# Patient Record
Sex: Female | Born: 1975 | Race: White | Hispanic: No | Marital: Married | Smoking: Former smoker
Health system: Southern US, Community
[De-identification: ages and names within clinical notes are randomized; demographics above are authoritative.]

## PROBLEM LIST (undated history)

## (undated) DIAGNOSIS — D860 Sarcoidosis of lung: Secondary | ICD-10-CM

## (undated) DIAGNOSIS — C55 Malignant neoplasm of uterus, part unspecified: Secondary | ICD-10-CM

## (undated) HISTORY — DX: Malignant neoplasm of uterus, part unspecified: C55

## (undated) HISTORY — DX: Sarcoidosis of lung: D86.0

---

## 2021-03-11 ENCOUNTER — Emergency Department (HOSPITAL_COMMUNITY)
Admission: EM | Admit: 2021-03-11 | Discharge: 2021-03-11 | Disposition: A | Discharge status: Home / Self Care | Payer: 59 | Attending: Student | Admitting: Student

## 2021-03-11 ENCOUNTER — Encounter (HOSPITAL_COMMUNITY): Payer: Self-pay

## 2021-03-11 ENCOUNTER — Emergency Department (HOSPITAL_COMMUNITY): Payer: 59

## 2021-03-11 DIAGNOSIS — R079 Chest pain, unspecified: Secondary | ICD-10-CM | POA: Diagnosis not present

## 2021-03-11 DIAGNOSIS — R519 Headache, unspecified: Secondary | ICD-10-CM | POA: Diagnosis not present

## 2021-03-11 DIAGNOSIS — R42 Dizziness and giddiness: Secondary | ICD-10-CM | POA: Diagnosis not present

## 2021-03-11 DIAGNOSIS — R110 Nausea: Secondary | ICD-10-CM | POA: Insufficient documentation

## 2021-03-11 DIAGNOSIS — R0789 Other chest pain: Secondary | ICD-10-CM | POA: Diagnosis not present

## 2021-03-11 DIAGNOSIS — Z20822 Contact with and (suspected) exposure to covid-19: Secondary | ICD-10-CM | POA: Insufficient documentation

## 2021-03-11 DIAGNOSIS — R9431 Abnormal electrocardiogram [ECG] [EKG]: Secondary | ICD-10-CM | POA: Diagnosis not present

## 2021-03-11 DIAGNOSIS — R030 Elevated blood-pressure reading, without diagnosis of hypertension: Secondary | ICD-10-CM | POA: Insufficient documentation

## 2021-03-11 DIAGNOSIS — I10 Essential (primary) hypertension: Secondary | ICD-10-CM | POA: Diagnosis not present

## 2021-03-11 LAB — CBC WITH DIFFERENTIAL/PLATELET
Abs Immature Granulocytes: 0.01 10*3/uL (ref 0.00–0.07)
Basophils Absolute: 0 10*3/uL (ref 0.0–0.1)
Basophils Relative: 1 %
Eosinophils Absolute: 0.2 10*3/uL (ref 0.0–0.5)
Eosinophils Relative: 3 %
HCT: 40.4 % (ref 36.0–46.0)
Hemoglobin: 12.9 g/dL (ref 12.0–15.0)
Immature Granulocytes: 0 %
Lymphocytes Relative: 19 %
Lymphs Abs: 1.3 10*3/uL (ref 0.7–4.0)
MCH: 27.9 pg (ref 26.0–34.0)
MCHC: 31.9 g/dL (ref 30.0–36.0)
MCV: 87.3 fL (ref 80.0–100.0)
Monocytes Absolute: 0.4 10*3/uL (ref 0.1–1.0)
Monocytes Relative: 6 %
Neutro Abs: 4.7 10*3/uL (ref 1.7–7.7)
Neutrophils Relative %: 71 %
Platelets: 263 10*3/uL (ref 150–400)
RBC: 4.63 MIL/uL (ref 3.87–5.11)
RDW: 13 % (ref 11.5–15.5)
WBC: 6.6 10*3/uL (ref 4.0–10.5)
nRBC: 0 % (ref 0.0–0.2)

## 2021-03-11 LAB — COMPREHENSIVE METABOLIC PANEL
ALT: 6 U/L (ref 0–44)
AST: 13 U/L — ABNORMAL LOW (ref 15–41)
Albumin: 4.4 g/dL (ref 3.5–5.0)
Alkaline Phosphatase: 68 U/L (ref 38–126)
Anion gap: 8 (ref 5–15)
BUN: 12 mg/dL (ref 6–20)
CO2: 26 mmol/L (ref 22–32)
Calcium: 9.3 mg/dL (ref 8.9–10.3)
Chloride: 104 mmol/L (ref 98–111)
Creatinine, Ser: 0.88 mg/dL (ref 0.44–1.00)
GFR, Estimated: >60 mL/min (ref >60)
Glucose, Bld: 96 mg/dL (ref 70–99)
Potassium: 3.8 mmol/L (ref 3.5–5.1)
Sodium: 138 mmol/L (ref 135–145)
Total Bilirubin: 0.6 mg/dL (ref 0.3–1.2)
Total Protein: 7.6 g/dL (ref 6.5–8.1)

## 2021-03-11 LAB — TROPONIN I (HIGH SENSITIVITY)
Troponin I (High Sensitivity): <2 ng/L (ref <18)
Troponin I (High Sensitivity): <2 ng/L (ref <18)

## 2021-03-11 LAB — RESP PANEL BY RT-PCR (FLU A&B, COVID) ARPGX2
Influenza A by PCR: NEGATIVE
Influenza B by PCR: NEGATIVE
SARS Coronavirus 2 by RT PCR: NEGATIVE

## 2021-03-11 MED ORDER — ACETAMINOPHEN 325 MG PO TABS
650.0000 mg | ORAL_TABLET | Freq: Once | ORAL | Status: AC
  Administered 2021-03-11: 650 mg via ORAL
  Filled 2021-03-11: qty 2

## 2021-03-11 MED ORDER — ASPIRIN 81 MG PO CHEW
324.0000 mg | CHEWABLE_TABLET | Freq: Once | ORAL | Status: AC
  Administered 2021-03-11: 324 mg via ORAL
  Filled 2021-03-11: qty 4

## 2021-03-11 NOTE — ED Triage Notes (Signed)
Pt arrived from work, states sudden onset left sided chest pain x1 hr ago, radiating to left arm. States earlier today she had headache and numbness in left hand/fingers. 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

## 2021-03-11 NOTE — ED Provider Notes (Signed)
Emergency Medicine Provider Triage Evaluation Note  Natasha Horton , a 45 y.o. female  was evaluated in triage.  Pt complains of chest pain and pressure, dizziness, lightheadedness, nausea.  Symptoms began around 9:00 this morning.  Initially were intermittent, but now is more severe.  History of inverted T waves, no other cardiac history.  Review of Systems  Positive: cp Negative: sob  Physical Exam  BP (!) 148/105 (BP Location: Left Arm)   Pulse 87   Temp 98.5 F (36.9 C) (Oral)   Resp 18   SpO2 100%  Gen:   Awake, no distress   Resp:  Normal effort  MSK:   Moves extremities without difficulty    Medical Decision Making  Medically screening exam initiated at 12:20 PM.  Appropriate orders placed.  Natasha Horton was informed that the remainder of the evaluation will be completed by another provider, this initial triage assessment does not replace that evaluation, and the importance of remaining in the ED until their evaluation is complete.  Cxr, ekg, labs   Franchot Heidelberg, PA-C 03/11/21 [place], [place], MD 03/11/21 1721

## 2021-03-11 NOTE — ED Provider Notes (Signed)
[facility] DEPT Provider Note   CSN: [id number] Arrival date & time: 03/11/21  1147     History Chief Complaint  Patient presents with   Chest Pain    Natasha Horton is a 45 y.o. female presenting for evaluation of chest pain.  Patient states around 9:00 today she developed chest pain.  She had associated nausea and lightheadedness.  She also has a headache.  She has not taken anything for her symptoms.  Has not had any aspirin.  She has a history of inverted T waves, but no other cardiac history.  She has no other medical problems.  She will also very concerned about her blood pressure, which is been in the 150s over 100 since this began.  No history of high blood pressure.  She denies shortness of breath, diaphoresis, vomiting, abdominal pain, urinary symptoms, abnormal bowel movements.  HPI     History reviewed. No pertinent past medical history.  There are no problems to display for this patient.    OB History   No obstetric history on file.     History reviewed. No pertinent family history.     Home Medications Prior to Admission medications   Not on File    Allergies    Patient has no known allergies.  Review of Systems   Review of Systems  Cardiovascular:  Positive for chest pain.  Gastrointestinal:  Positive for nausea.  Neurological:  Positive for light-headedness.  All other systems reviewed and are negative.  Physical Exam Updated Vital Signs BP (!) 146/93   Pulse 82   Temp 98 F (36.7 C) (Oral)   Resp 18   SpO2 100%   Physical Exam Vitals and nursing note reviewed.  Constitutional:      General: She is not in acute distress.    Appearance: Normal appearance.     Comments: nontoxic  HENT:     Head: Normocephalic and atraumatic.  Eyes:     Conjunctiva/sclera: Conjunctivae normal.     Pupils: Pupils are equal, round, and reactive to light.  Cardiovascular:     Rate and Rhythm: Normal rate and regular  rhythm.     Pulses: Normal pulses.  Pulmonary:     Effort: Pulmonary effort is normal. No respiratory distress.     Breath sounds: Normal breath sounds. No wheezing.     Comments: Speaking in full sentences.  Clear lung sounds in all fields. Abdominal:     General: There is no distension.     Palpations: Abdomen is soft. There is no mass.     Tenderness: There is no abdominal tenderness. There is no guarding or rebound.  Musculoskeletal:        General: Normal range of motion.     Cervical back: Normal range of motion and neck supple.  Skin:    General: Skin is warm and dry.     Capillary Refill: Capillary refill takes less than 2 seconds.  Neurological:     Mental Status: She is alert and oriented to person, place, and time.  Psychiatric:        Mood and Affect: Mood and affect normal.        Speech: Speech normal.        Behavior: Behavior normal.    ED Results / Procedures / Treatments   Labs (all labs ordered are listed, but only abnormal results are displayed) Labs Reviewed  COMPREHENSIVE METABOLIC PANEL - Abnormal; Notable for the following components:  Result Value   AST 13 (*)    All other components within normal limits  RESP PANEL BY RT-PCR (FLU A&B, COVID) ARPGX2  CBC WITH DIFFERENTIAL/PLATELET  I-STAT BETA HCG BLOOD, ED ([facility], [facility], [facility] ONLY)  TROPONIN I (HIGH SENSITIVITY)  TROPONIN I (HIGH SENSITIVITY)    EKG EKG Interpretation  Date/Time:  Wednesday March 11 2021 12:03:24 EDT Ventricular Rate:  78 PR Interval:  156 QRS Duration: 138 QT Interval:  413 QTC Calculation: 471 R Axis:   89 Text Interpretation: Sinus rhythm Right bundle branch block NO prior ECG for comparison. No STEMI Confirmed by Antony Blackbird [id number]) on 03/11/2021 6:01:39 PM  Radiology DG Chest 2 View  Result Date: 03/11/2021 CLINICAL DATA:  Chest pain EXAM: CHEST - 2 VIEW COMPARISON:  None. FINDINGS: Heart size and mediastinal contours are within normal limits. No suspicious  opacities identified. No pleural effusion or pneumothorax visualized. No acute osseous abnormality appreciated. IMPRESSION: No acute intrathoracic process identified. Electronically Signed   By: Ofilia Neas   On: 03/11/2021 13:10    Procedures Procedures   Medications Ordered in ED Medications  aspirin chewable tablet 324 mg (324 mg Oral Given 03/11/21 1227)  acetaminophen (TYLENOL) tablet 650 mg (650 mg Oral Given 03/11/21 1227)    ED Course  I have reviewed the triage vital signs and the nursing notes.  Pertinent labs & imaging results that were available during my care of the patient were reviewed by me and considered in my medical decision making (see chart for details).   MDM Rules/Calculators/A&P                           Patient presenting for evaluation of chest pain.  On exam, patient is nontoxic.  She is concerned about slightly elevated blood pressure, however I have low suspicion that these numbers are causing any signs of endorgan damage.  However in the setting of acute onset chest pain, will obtain troponins x2, labs, EKG, chest x-ray.  Aspirin given while work-up is pending.  Labs interpreted by me, overall reassuring.  Troponin negative x2.  Chest x-ray viewed and independently interpreted by me, no pneumonia pneumothorax, effusion.  EKG is nonischemic, shows a right bundle branch block.  Patient instructed to follow-up with cardiology for abnormal EKG.  Discussed follow-up with PCP as needed for blood pressure recheck, however elevated blood pressure today could be due to the pain.  On reevaluation, patient has no chest pain anymore.  She does continue to have a mild headache and feel slightly fatigued.  Will obtain respiratory panel.  Patient is aware results are pending.  At this time, patient appears safe for discharge.  Return precautions given.  Patient states she understands and agrees to plan  Final Clinical Impression(s) / ED Diagnoses Final diagnoses:   Atypical chest pain  Abnormal EKG  Elevated blood pressure reading in office without diagnosis of hypertension    Rx / DC Orders ED Discharge Orders     None        Franchot Heidelberg, PA-C 03/11/21 1843    [place], Gwenyth Allegra, MD 03/11/21 [phone]

## 2021-03-11 NOTE — Discharge Instructions (Addendum)
Your work up today was overall reassuring. Your troponins are negative x2.  Follow up with the cardiologist for further evaluation of your chest pain.  Follow up with your primary care doctor for further evaluation of your blood pressure. Return to the ER with any new, worsening, or concerning symptoms.

## 2021-03-11 NOTE — ED Notes (Signed)
An After Visit Summary was printed and given to the patient. Discharge instructions given and no further questions at this time.  

## 2021-03-12 DIAGNOSIS — R4586 Emotional lability: Secondary | ICD-10-CM | POA: Diagnosis not present

## 2021-03-12 DIAGNOSIS — R9431 Abnormal electrocardiogram [ECG] [EKG]: Secondary | ICD-10-CM | POA: Diagnosis not present

## 2021-03-12 DIAGNOSIS — F419 Anxiety disorder, unspecified: Secondary | ICD-10-CM | POA: Diagnosis not present

## 2021-03-12 DIAGNOSIS — R079 Chest pain, unspecified: Secondary | ICD-10-CM | POA: Diagnosis not present

## 2021-03-19 DIAGNOSIS — R9431 Abnormal electrocardiogram [ECG] [EKG]: Secondary | ICD-10-CM | POA: Diagnosis not present

## 2021-03-19 DIAGNOSIS — E669 Obesity, unspecified: Secondary | ICD-10-CM | POA: Diagnosis not present

## 2021-03-19 DIAGNOSIS — R030 Elevated blood-pressure reading, without diagnosis of hypertension: Secondary | ICD-10-CM | POA: Diagnosis not present

## 2021-03-19 DIAGNOSIS — R002 Palpitations: Secondary | ICD-10-CM | POA: Diagnosis not present

## 2021-03-19 DIAGNOSIS — Z87891 Personal history of nicotine dependence: Secondary | ICD-10-CM | POA: Diagnosis not present

## 2021-03-19 DIAGNOSIS — R079 Chest pain, unspecified: Secondary | ICD-10-CM | POA: Diagnosis not present

## 2021-04-01 DIAGNOSIS — R002 Palpitations: Secondary | ICD-10-CM | POA: Diagnosis not present

## 2021-04-01 DIAGNOSIS — R079 Chest pain, unspecified: Secondary | ICD-10-CM | POA: Diagnosis not present

## 2021-04-01 DIAGNOSIS — R9431 Abnormal electrocardiogram [ECG] [EKG]: Secondary | ICD-10-CM | POA: Diagnosis not present

## 2021-04-07 DIAGNOSIS — R9431 Abnormal electrocardiogram [ECG] [EKG]: Secondary | ICD-10-CM | POA: Diagnosis not present

## 2021-04-07 DIAGNOSIS — R079 Chest pain, unspecified: Secondary | ICD-10-CM | POA: Diagnosis not present

## 2021-04-07 DIAGNOSIS — R002 Palpitations: Secondary | ICD-10-CM | POA: Diagnosis not present

## 2021-04-15 DIAGNOSIS — R079 Chest pain, unspecified: Secondary | ICD-10-CM | POA: Diagnosis not present

## 2021-04-27 DIAGNOSIS — J4530 Mild persistent asthma, uncomplicated: Secondary | ICD-10-CM | POA: Diagnosis not present

## 2021-04-27 DIAGNOSIS — F419 Anxiety disorder, unspecified: Secondary | ICD-10-CM | POA: Diagnosis not present

## 2021-04-27 DIAGNOSIS — R590 Localized enlarged lymph nodes: Secondary | ICD-10-CM | POA: Diagnosis not present

## 2021-04-27 DIAGNOSIS — R002 Palpitations: Secondary | ICD-10-CM | POA: Diagnosis not present

## 2021-04-27 DIAGNOSIS — D860 Sarcoidosis of lung: Secondary | ICD-10-CM | POA: Diagnosis not present

## 2021-04-27 DIAGNOSIS — R9431 Abnormal electrocardiogram [ECG] [EKG]: Secondary | ICD-10-CM | POA: Diagnosis not present

## 2021-04-27 DIAGNOSIS — Z87891 Personal history of nicotine dependence: Secondary | ICD-10-CM | POA: Diagnosis not present

## 2021-05-12 DIAGNOSIS — B349 Viral infection, unspecified: Secondary | ICD-10-CM | POA: Diagnosis not present

## 2021-05-12 DIAGNOSIS — D860 Sarcoidosis of lung: Secondary | ICD-10-CM | POA: Diagnosis not present

## 2021-05-12 DIAGNOSIS — J020 Streptococcal pharyngitis: Secondary | ICD-10-CM | POA: Diagnosis not present

## 2021-05-14 DIAGNOSIS — R590 Localized enlarged lymph nodes: Secondary | ICD-10-CM | POA: Diagnosis not present

## 2021-05-20 DIAGNOSIS — Z8659 Personal history of other mental and behavioral disorders: Secondary | ICD-10-CM | POA: Diagnosis not present

## 2021-05-20 DIAGNOSIS — G4700 Insomnia, unspecified: Secondary | ICD-10-CM | POA: Diagnosis not present

## 2021-05-20 DIAGNOSIS — F411 Generalized anxiety disorder: Secondary | ICD-10-CM | POA: Diagnosis not present

## 2021-05-20 DIAGNOSIS — F419 Anxiety disorder, unspecified: Secondary | ICD-10-CM | POA: Diagnosis not present

## 2021-05-25 DIAGNOSIS — R002 Palpitations: Secondary | ICD-10-CM | POA: Diagnosis not present

## 2021-05-25 DIAGNOSIS — Z87891 Personal history of nicotine dependence: Secondary | ICD-10-CM | POA: Diagnosis not present

## 2021-05-25 DIAGNOSIS — I4729 Other ventricular tachycardia: Secondary | ICD-10-CM | POA: Diagnosis not present

## 2021-05-25 DIAGNOSIS — E669 Obesity, unspecified: Secondary | ICD-10-CM | POA: Diagnosis not present

## 2021-05-25 DIAGNOSIS — R9431 Abnormal electrocardiogram [ECG] [EKG]: Secondary | ICD-10-CM | POA: Diagnosis not present

## 2021-05-25 DIAGNOSIS — R079 Chest pain, unspecified: Secondary | ICD-10-CM | POA: Diagnosis not present

## 2021-05-29 DIAGNOSIS — Z87891 Personal history of nicotine dependence: Secondary | ICD-10-CM | POA: Diagnosis not present

## 2021-05-29 DIAGNOSIS — J4530 Mild persistent asthma, uncomplicated: Secondary | ICD-10-CM | POA: Diagnosis not present

## 2021-05-29 DIAGNOSIS — D860 Sarcoidosis of lung: Secondary | ICD-10-CM | POA: Diagnosis not present

## 2021-06-11 DIAGNOSIS — F419 Anxiety disorder, unspecified: Secondary | ICD-10-CM | POA: Diagnosis not present

## 2021-06-11 DIAGNOSIS — G4700 Insomnia, unspecified: Secondary | ICD-10-CM | POA: Diagnosis not present

## 2021-06-11 DIAGNOSIS — F411 Generalized anxiety disorder: Secondary | ICD-10-CM | POA: Diagnosis not present

## 2021-06-11 DIAGNOSIS — Z8659 Personal history of other mental and behavioral disorders: Secondary | ICD-10-CM | POA: Diagnosis not present

## 2021-07-01 ENCOUNTER — Other Ambulatory Visit (HOSPITAL_COMMUNITY): Payer: Self-pay

## 2021-07-02 ENCOUNTER — Other Ambulatory Visit (HOSPITAL_COMMUNITY): Payer: Self-pay

## 2021-07-02 MED ORDER — MONTELUKAST SODIUM 10 MG PO TABS
ORAL_TABLET | ORAL | 10 refills | Status: AC
  Filled 2021-07-02 – 2021-08-15 (×2): qty 30, 30d supply, fill #0
  Filled 2021-10-06: qty 30, 30d supply, fill #1

## 2021-07-02 MED ORDER — SERTRALINE HCL 100 MG PO TABS
100.0000 mg | ORAL_TABLET | ORAL | 3 refills | Status: AC
  Filled 2021-07-02: qty 30, 30d supply, fill #0
  Filled 2021-08-15: qty 30, 30d supply, fill #1
  Filled 2021-10-06: qty 30, 30d supply, fill #2

## 2021-07-02 MED ORDER — ALBUTEROL SULFATE HFA 108 (90 BASE) MCG/ACT IN AERS
INHALATION_SPRAY | RESPIRATORY_TRACT | 11 refills | Status: AC
  Filled 2021-07-02: qty 18, 30d supply, fill #0

## 2021-07-02 MED ORDER — FLUTICASONE-SALMETEROL 115-21 MCG/ACT IN AERO
INHALATION_SPRAY | RESPIRATORY_TRACT | 11 refills | Status: AC
  Filled 2021-07-02: qty 12, 30d supply, fill #0

## 2021-07-03 ENCOUNTER — Other Ambulatory Visit (HOSPITAL_COMMUNITY): Payer: Self-pay

## 2021-07-03 MED ORDER — ALBUTEROL SULFATE HFA 108 (90 BASE) MCG/ACT IN AERS
INHALATION_SPRAY | RESPIRATORY_TRACT | 11 refills | Status: AC
  Filled 2021-07-03: qty 18, 30d supply, fill #0

## 2021-07-07 DIAGNOSIS — F419 Anxiety disorder, unspecified: Secondary | ICD-10-CM | POA: Diagnosis not present

## 2021-07-07 DIAGNOSIS — F411 Generalized anxiety disorder: Secondary | ICD-10-CM | POA: Diagnosis not present

## 2021-07-07 DIAGNOSIS — Z8659 Personal history of other mental and behavioral disorders: Secondary | ICD-10-CM | POA: Diagnosis not present

## 2021-08-15 ENCOUNTER — Other Ambulatory Visit (HOSPITAL_COMMUNITY): Payer: Self-pay

## 2021-09-17 ENCOUNTER — Other Ambulatory Visit (HOSPITAL_COMMUNITY): Payer: Self-pay

## 2021-09-17 DIAGNOSIS — J4530 Mild persistent asthma, uncomplicated: Secondary | ICD-10-CM | POA: Diagnosis not present

## 2021-09-17 DIAGNOSIS — D860 Sarcoidosis of lung: Secondary | ICD-10-CM | POA: Diagnosis not present

## 2021-09-17 MED ORDER — FLUTICASONE-SALMETEROL 230-21 MCG/ACT IN AERO
INHALATION_SPRAY | RESPIRATORY_TRACT | 11 refills | Status: AC
  Filled 2021-09-17: qty 12, 30d supply, fill #0

## 2021-09-22 ENCOUNTER — Other Ambulatory Visit (HOSPITAL_COMMUNITY): Payer: Self-pay

## 2021-09-22 MED ORDER — CLOBETASOL PROPIONATE 0.05 % EX OINT
TOPICAL_OINTMENT | CUTANEOUS | 0 refills | Status: AC
  Filled 2021-09-22: qty 45, 14d supply, fill #0
  Filled 2021-10-06: qty 45, 30d supply, fill #0

## 2021-09-30 ENCOUNTER — Other Ambulatory Visit (HOSPITAL_COMMUNITY): Payer: Self-pay

## 2021-10-06 ENCOUNTER — Other Ambulatory Visit (HOSPITAL_COMMUNITY): Payer: Self-pay

## 2021-11-23 ENCOUNTER — Other Ambulatory Visit (HOSPITAL_COMMUNITY): Payer: Self-pay

## 2021-11-23 MED ORDER — CYCLOBENZAPRINE HCL 5 MG PO TABS
ORAL_TABLET | ORAL | 0 refills | Status: DC
  Filled 2021-11-23: qty 15, 15d supply, fill #0

## 2021-12-09 ENCOUNTER — Ambulatory Visit: Payer: PRIVATE HEALTH INSURANCE | Attending: Nurse Practitioner | Admitting: Physical Therapy

## 2021-12-09 ENCOUNTER — Encounter: Payer: Self-pay | Admitting: Physical Therapy

## 2021-12-09 DIAGNOSIS — M5441 Lumbago with sciatica, right side: Secondary | ICD-10-CM | POA: Insufficient documentation

## 2021-12-09 DIAGNOSIS — M5415 Radiculopathy, thoracolumbar region: Secondary | ICD-10-CM | POA: Diagnosis present

## 2021-12-09 DIAGNOSIS — M62830 Muscle spasm of back: Secondary | ICD-10-CM | POA: Diagnosis present

## 2021-12-09 DIAGNOSIS — M546 Pain in thoracic spine: Secondary | ICD-10-CM | POA: Diagnosis present

## 2021-12-09 NOTE — Therapy (Signed)
OUTPATIENT PHYSICAL THERAPY THORACOLUMBAR EVALUATION   Patient Name: Natasha Horton MRN: [id number] DOB:1975/12/31, 46 y.o., female Today's Date: 12/09/2021   PT End of Session - 12/09/21 1613     Visit Number 1    Number of Visits 7    Date for PT Re-Evaluation 01/08/22    Authorization Type Workman's Comp    Authorization - Visit Number 0    Authorization - Number of Visits 7    PT Start Time 7510    PT Stop Time 1403    PT Time Calculation (min) 48 min    Activity Tolerance Patient tolerated treatment well    Behavior During Therapy WFL for tasks assessed/performed             Past Medical History:  Diagnosis Date   Sarcoidosis of lung ([place])    Uterine cancer ([place])    History reviewed. No pertinent surgical history. There are no problems to display for this patient.   PCP: not in system  REFERRING PROVIDER: Drucilla Chalet, NP  REFERRING DIAG: S29.011A (ICD-10-CM) - Strain of muscle and tendon of front wall of thorax, initial encounter S29.012A (ICD-10-CM) - Strain of muscle and tendon of back wall of thorax, initial encounter S20.121A (ICD-10-CM) - Blister (nonthermal) of breast, right breast, initial encounter S20.211A (ICD-10-CM) - Contusion of right front wall of thorax, initial encounter S39.011A (ICD-10-CM) - Strain of muscle, fascia and tendon of abdomen, initial encounter S39.012A (ICD-10-CM) - Strain of muscle, fascia and tendon of lower back, initial encounter R20.2 (ICD-10-CM) - Paresthesia of skin  Rationale for Evaluation and Treatment Rehabilitation  THERAPY DIAG:  Acute midline low back pain with right-sided sciatica  Pain in thoracic spine  Muscle spasm of back  Radiculopathy, thoracolumbar region  ONSET DATE: 11/19/2021  SUBJECTIVE:                                                                                                                                                                                           SUBJECTIVE  STATEMENT: 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   She returned to work next day but was in a lot of pain, by Monday she was really hurt.  She had bruising on her abdomen from bar, this soreness has improved, but continues to have pain all down her spine, also reports loss of sensation  R side in both R arm and R leg, burning pain from back around to R groin.  Laying down decreases pain.  She is on work restrictions, but still having a lot of pain opening heavy doors, walking, and sitting.    PERTINENT HISTORY:  Lung Sarcoidosis; abnormal ECG, chest pain  (resolved), anxiety, past history uterine cancer, gall bladder removed  PAIN:  Are you having pain? Yes: NPRS scale: 8-9/10 Pain location: base of back up spine to neck Pain description: burning, aching, gets cramping in upper back with using arms Aggravating factors: sitting prolonged times, walking - constantly has to change position Relieving factors: laying down   PRECAUTIONS: Back  work restrictions: no lifting more than 5 lbs, no lifting hands over head.  History of Cancer.   WEIGHT BEARING RESTRICTIONS No  FALLS:  Has patient fallen in last 6 months? No  LIVING ENVIRONMENT: Lives with: lives with their spouse and lives with their son Lives in: [place]/apartment Stairs: Yes: External: 8 steps; on right going up Has following equipment at home: None  OCCUPATION: Nurse at [employer]   PLOF: Independent  PATIENT GOALS improve pain   OBJECTIVE:   DIAGNOSTIC FINDINGS:  None available for review   PATIENT SURVEYS:  Modified Oswestry 38%= moderate disability  FOTO TBA  SCREENING FOR RED FLAGS: Bowel or bladder incontinence: No Spinal tumors: NA Cauda equina  syndrome: No Compression fracture: No imaging Abdominal aneurysm: NA  COGNITION:  Overall cognitive status: Within functional limits for tasks assessed     SENSATION: Decreased R UE sensation along C5-6 dermatomes, also along R L3-5 dermatomes 50-75%   MUSCLE LENGTH: Hamstrings: Right 80 deg; Left 80 deg ely's test: increased pain R prone knee flexion into groin, R tighter than L.   POSTURE: No Significant postural limitations  PALPATION: Diffuse tenderness throughout thoracic and lumbar paraspinals, pain with PA mob from T2 to sacrum, reports increased tenderness T6-T10, bil QL, R glut med, R SIJ, R piriformis.   LUMBAR ROM:   Active  A/PROM  eval  Flexion To knees, increases pain radiating to R going  Extension 50%, inc pain base of spine  Left lateral flexion Inc pain to R groin 50%  Right lateral flexion 50%, Inc pain upper back L side  Right rotation Pain shoots up back 50%  Left rotation 50% feels it in L shoulder, popping.    (Blank rows = not tested)  LOWER EXTREMITY ROM:   tightness and pain with all R hip movements.  L hip WNL   LOWER EXTREMITY MMT:    MMT Right eval Left eval  Hip flexion 5 5* pain  Hip extension    Hip abduction 5 5  Hip adduction 5 5  Knee flexion 5 5  Knee extension    Ankle dorsiflexion 5 5  Ankle plantarflexion     (Blank rows = not tested)  Upper Extremity MMT:  5/5 bil UE strength, but increased pain in thoracic spine with resistance.    LUMBAR SPECIAL TESTS:  Straight leg raise test: Negative and FABER test: Positive R hip with pain radiating to R groin  FUNCTIONAL TESTS:  5 times sit to stand: 22 seconds  GAIT: Distance walked: 50 Assistive device utilized: None Level of assistance: Complete Independence Comments: no significant deviation or device, increased pain with prolonged walking.   TODAY'S TREATMENT  12/09/2021 - see patient education.    PATIENT EDUCATION:  Education details: education on findings, POC,  recommendations for sleep (try pillows under  knees), modalities (try CP, also provided information on TENS).  Person educated: Patient Education method: Theatre stage manager Education comprehension: verbalized understanding   HOME EXERCISE PROGRAM: Access Code: [place] URL: [email or web address] Date: 12/09/2021 Prepared by: Glenetta Hew  Patient Education - TENS Therapy - TENS Unit  ASSESSMENT:  CLINICAL IMPRESSION: Natasha Horton  is a 46 y.o. female who was seen today for physical therapy evaluation and treatment for lumbar/thoracic pain following injury at work. She demonstrates diffuse pain throughout lumbar and thoracic spine, impaired sensation along R C6 and L3/4 dermatomes, decreased lumbar mobility, but no strength deficits.  On modified Oswestry reports 38% - moderate disability due to pain.  She reports difficulty and increased pain with job activities, sitting, standing, and walking and currently only laying down provides relief.  No imaging has been performed.  Discussed today sleep positioning and recommendations for purchase of TENS unit to help decrease spasms.  Natasha Horton would benefit from skilled physical therapy to decrease pain, improve QOL, and allow return to activities without limitation.     OBJECTIVE IMPAIRMENTS decreased activity tolerance, decreased endurance, decreased mobility, difficulty walking, decreased ROM, hypomobility, increased fascial restrictions, impaired perceived functional ability, increased muscle spasms, impaired flexibility, impaired UE functional use, and pain.   ACTIVITY LIMITATIONS carrying, lifting, bending, sitting, standing, squatting, transfers, reach over head, locomotion level, and caring for others  PARTICIPATION LIMITATIONS: meal prep, cleaning, laundry, shopping, community activity, occupation, and yard work  PERSONAL FACTORS 1-2 comorbidities: history sarcoidosis, cancer, anxiety  are also affecting  patient's functional outcome.   REHAB POTENTIAL: Good  CLINICAL DECISION MAKING: Evolving/moderate complexity  EVALUATION COMPLEXITY: Moderate   GOALS: Goals reviewed with patient? Yes  SHORT TERM GOALS: Target date: 12/23/2021   Patient will be independent with initial HEP.  Baseline: no HEP Goal status: INITIAL  2.  Patient will report centralization of radicular symptoms.  Baseline: pain radiates to R groin Goal status: INITIAL  3.  Patient will be able to raise arms overhead without increased pain to perform job activities.  Baseline: increases thoracic pain Goal status: INITIAL   LONG TERM GOALS: Target date: 01/08/2022   Patient will be independent with advanced/ongoing HEP to improve outcomes and carryover.  Baseline: no HEP Goal status: INITIAL  2.  Patient will report 75% improvement in low back pain to improve QOL.  Baseline: 8-9/10 thoracic and lumbar pain Goal status: INITIAL  3.  Patient will demonstrate full pain free lumbar ROM to perform ADLs.   Baseline: see objective, increased pain with all movements Goal status: INITIAL  4.  Patient will demonstrate improved functional strength as demonstrated by 5x STS < 14 seconds. Baseline: 22 seconds Goal status: INITIAL  5.  Patient will report <20% impairment on Oswestry and MCID on lumbar FOTO to demonstrate improved functional ability.  Baseline: 38% disability on Oswestry Goal status: INITIAL   6.  Patient will tolerate 30 min of standing, sitting, or walking without increased pain to perform job duties.  Baseline: increased pain after 5 min Goal status: INITIAL  7.  Patient will be able to lift 20lbs without increased back pain.   Baseline: lifting t-shirts increases pain  Goal status: INITIAL    PLAN: PT FREQUENCY: 1-2x/week  PT DURATION: 4 weeks - due to workman's comp limit of 7 visits.  May need to be extended.   PLANNED INTERVENTIONS: Therapeutic exercises, Therapeutic activity,  Neuromuscular re-education, Balance training, Gait training, Patient/Family education, Self Care, Joint mobilization, Aquatic Therapy, Dry Needling, Electrical  stimulation, Spinal mobilization, Cryotherapy, Moist heat, Traction, Manual therapy, and Re-evaluation.  PLAN FOR NEXT SESSION: FOTO, start neutral spine exercises, TMR, manual therapy, modalities PRN - try TENS.  Also assess cervical spine.    Rennie Natter, PT, DPT  12/09/2021, 4:43 PM

## 2021-12-16 ENCOUNTER — Ambulatory Visit: Payer: PRIVATE HEALTH INSURANCE | Attending: Nurse Practitioner | Admitting: Physical Therapy

## 2021-12-16 ENCOUNTER — Encounter: Payer: Self-pay | Admitting: Physical Therapy

## 2021-12-16 DIAGNOSIS — M546 Pain in thoracic spine: Secondary | ICD-10-CM | POA: Diagnosis present

## 2021-12-16 DIAGNOSIS — M5441 Lumbago with sciatica, right side: Secondary | ICD-10-CM | POA: Diagnosis present

## 2021-12-16 DIAGNOSIS — M62830 Muscle spasm of back: Secondary | ICD-10-CM | POA: Insufficient documentation

## 2021-12-16 DIAGNOSIS — M5415 Radiculopathy, thoracolumbar region: Secondary | ICD-10-CM | POA: Insufficient documentation

## 2021-12-16 NOTE — Therapy (Signed)
OUTPATIENT PHYSICAL THERAPY Treatment Note   Patient Name: Natasha Horton MRN: [id number] DOB:08/22/1975, 46 y.o., female Today's Date: 12/16/2021   PT End of Session - 12/16/21 1313     Visit Number 2    Number of Visits 7    Date for PT Re-Evaluation 01/08/22    Authorization Type Workman's Comp    Authorization - Visit Number 0    Authorization - Number of Visits 7    PT Start Time 7564    PT Stop Time 1418    PT Time Calculation (min) 63 min    Activity Tolerance Patient tolerated treatment well    Behavior During Therapy WFL for tasks assessed/performed             Past Medical History:  Diagnosis Date   Sarcoidosis of lung ([place])    Uterine cancer ([place])    History reviewed. No pertinent surgical history. There are no problems to display for this patient.   PCP: not in system  REFERRING PROVIDER: Drucilla Chalet, NP  REFERRING DIAG: S29.011A (ICD-10-CM) - Strain of muscle and tendon of front wall of thorax, initial encounter S29.012A (ICD-10-CM) - Strain of muscle and tendon of back wall of thorax, initial encounter S20.121A (ICD-10-CM) - Blister (nonthermal) of breast, right breast, initial encounter S20.211A (ICD-10-CM) - Contusion of right front wall of thorax, initial encounter S39.011A (ICD-10-CM) - Strain of muscle, fascia and tendon of abdomen, initial encounter S39.012A (ICD-10-CM) - Strain of muscle, fascia and tendon of lower back, initial encounter R20.2 (ICD-10-CM) - Paresthesia of skin  Rationale for Evaluation and Treatment Rehabilitation  THERAPY DIAG:  Acute midline low back pain with right-sided sciatica  Pain in thoracic spine  Muscle spasm of back  Radiculopathy, thoracolumbar region  ONSET DATE: 11/19/2021  SUBJECTIVE:                                                                                                                                                                                           SUBJECTIVE STATEMENT: Patient  reports back is feeling better, but has had more neck pain since initial evaluation.  Back is more stiffness than pain now.  No pain radiating into groin.  Her neck pain starts midback and goes up.   PERTINENT HISTORY:  Lung Sarcoidosis; abnormal ECG, chest pain  (resolved), anxiety, past history uterine cancer, gall bladder removed  PAIN:  Are you having pain? Yes: NPRS scale: 2/10 Pain location: low back more stiffness than pain, especially with transitions.  6/10 neck pain.   Pain description: burning, aching, gets cramping in upper back with using arms Aggravating factors: sitting prolonged times, walking -  constantly has to change position Relieving factors: laying down   PRECAUTIONS: Back  work restrictions: no lifting more than 5 lbs, no lifting hands over head.  History of Cancer.   WEIGHT BEARING RESTRICTIONS No  FALLS:  Has patient fallen in last 6 months? No  LIVING ENVIRONMENT: Lives with: lives with their spouse and lives with their son Lives in: [place]/apartment Stairs: Yes: External: 8 steps; on right going up Has following equipment at home: None  OCCUPATION: Nurse at [employer]   PLOF: Independent  PATIENT GOALS improve pain   OBJECTIVE:   DIAGNOSTIC FINDINGS:  None available for review   PATIENT SURVEYS:  Modified Oswestry 38%= moderate disability  FOTO TBA  SCREENING FOR RED FLAGS: Bowel or bladder incontinence: No Spinal tumors: NA Cauda equina syndrome: No Compression fracture: No imaging Abdominal aneurysm: NA  COGNITION:  Overall cognitive status: Within functional limits for tasks assessed     SENSATION: Decreased R UE sensation along C5-6 dermatomes, also along R L3-5 dermatomes 50-75%   MUSCLE LENGTH: Hamstrings: Right 80 deg; Left 80 deg ely's test: increased pain R prone knee flexion into groin, R tighter than L.   POSTURE: No Significant postural limitations  PALPATION: Diffuse tenderness throughout thoracic and lumbar  paraspinals, pain with PA mob from T2 to sacrum, reports increased tenderness T6-T10, bil QL, R glut med, R SIJ, R piriformis.   CERVICAL ROM: (12/16/2021)  AROM 12/16/2021  Cervical Flexion 50*  Cervical Extension 50*  Cervical Rotation to Left 60*   Cervical Rotation to Right 75* sounds like sandpaper  Left Sidebend 22*   Right Sidebend 25*   *pain.   CERVICAL SPECIAL TESTS:  Spurling's test: Negative, Distraction test: Positive, and Sharp pursor's test: Negative  LUMBAR ROM:   Active  A/PROM  eval  Flexion To knees, increases pain radiating to R going  Extension 50%, inc pain base of spine  Left lateral flexion Inc pain to R groin 50%  Right lateral flexion 50%, Inc pain upper back L side  Right rotation Pain shoots up back 50%  Left rotation 50% feels it in L shoulder, popping.    (Blank rows = not tested)  LOWER EXTREMITY ROM:   tightness and pain with all R hip movements.  L hip WNL   LOWER EXTREMITY MMT:    MMT Right eval Left eval  Hip flexion 5 5* pain  Hip extension    Hip abduction 5 5  Hip adduction 5 5  Knee flexion 5 5  Knee extension    Ankle dorsiflexion 5 5  Ankle plantarflexion     (Blank rows = not tested)  Upper Extremity MMT:  5/5 bil UE strength, but increased pain in thoracic spine with resistance.    LUMBAR SPECIAL TESTS:  Straight leg raise test: Negative and FABER test: Positive R hip with pain radiating to R groin  FUNCTIONAL TESTS:  5 times sit to stand: 22 seconds  GAIT: Distance walked: 50 Assistive device utilized: None Level of assistance: Complete Independence Comments: no significant deviation or device, increased pain with prolonged walking.   TODAY'S TREATMENT  12/16/2021 Therapeutic Exercise: to improve strength and mobility.  Demo, verbal and tactile cues throughout for technique. Nustep  L 5 x 5 min  Levator and UT stretches, retro shoulder rolls, post manual therapy Manual Therapy: to decrease muscle spasm and pain and  improve mobility STM/TPR to cervical paraspinals, bil UT and levator, mobs to cervical spine grade 1-2, NAGS into rotation, gentle traction.  skilled palpation and monitoring during dry needling.  Physical Performance - cervical ROM and assessment.   Modalities:CP to neck in supine with estim (premod) to bil thoracic paraspinals to decrease muscle spasm.     12/09/2021 - see patient education.    PATIENT EDUCATION:  Education details: education on findings, POC, recommendations for sleep (try pillows under knees), modalities (try CP, also provided information on TENS).  Person educated: Patient Education method: Theatre stage manager Education comprehension: verbalized understanding   HOME EXERCISE PROGRAM: Access Code: [place]   ASSESSMENT:  CLINICAL IMPRESSION: [place] reports improvement in low back pain and decreased radicular symptoms.  However she reported feeling increased spasm in cervical spine.  Cervical spine was assessed today, noted decreased cervical ROM, hypomobility in cervical and upper thoracic spine, and increased spasm especially in bil UT and levator scapulae.  No radiculopathy or other concerns in cervical spine, reassured patient most likely related to spasms from musculoskeletal strain.  Focused interventions today on manual therapy to cervical region, including dry needling after explanation of DN rational, procedures, outcomes and potential side effects.  Patient verbalized consent to DN treatment in conjunction with manual STM/DTM and TPR to reduce ttp/muscle tension. Muscles treated as indicated above. DN produced normal response with good twitches elicited resulting in palpable reduction in pain/ttp and muscle tension, with patient noting tightness upon initiation of movement following DN. Pt educated to expect mild to moderate muscle soreness for up to 24-48 hrs and instructed to continue prescribed home exercise program and current activity level with  pt verbalizing understanding of theses instructions.   She still had a lot of discomfort after manual therapy so estim applied to thoracic paraspinals along with CP to cervical region to decrease muscle spasm and pain.  Yalanda D Aguilar continues to demonstrate potential for improvement and would benefit from continued skilled therapy to address impairments.    OBJECTIVE IMPAIRMENTS decreased activity tolerance, decreased endurance, decreased mobility, difficulty walking, decreased ROM, hypomobility, increased fascial restrictions, impaired perceived functional ability, increased muscle spasms, impaired flexibility, impaired UE functional use, and pain.   ACTIVITY LIMITATIONS carrying, lifting, bending, sitting, standing, squatting, transfers, reach over head, locomotion level, and caring for others  PARTICIPATION LIMITATIONS: meal prep, cleaning, laundry, shopping, community activity, occupation, and yard work  PERSONAL FACTORS 1-2 comorbidities: history sarcoidosis, cancer, anxiety  are also affecting patient's functional outcome.   REHAB POTENTIAL: Good  CLINICAL DECISION MAKING: Evolving/moderate complexity  EVALUATION COMPLEXITY: Moderate   GOALS: Goals reviewed with patient? Yes  SHORT TERM GOALS: Target date: 12/23/2021   Patient will be independent with initial HEP.  Baseline: no HEP Goal status: IN PROGRESS  2.  Patient will report centralization of radicular symptoms.  Baseline: pain radiates to R groin Goal status: IN PROGRESS 12/16/21- no pain in groin.   3.  Patient will be able to raise arms overhead without increased pain to perform job activities.  Baseline: increases thoracic pain Goal status: IN PROGRESS   LONG TERM GOALS: Target date: 01/08/2022   Patient will be independent with advanced/ongoing HEP to improve outcomes and carryover.  Baseline: no HEP Goal status: IN PROGRESS  2.  Patient will report 75% improvement in low back pain to improve QOL.  Baseline:  8-9/10 thoracic and lumbar pain Goal status: IN PROGRESS  3.  Patient will demonstrate full pain free lumbar ROM to perform ADLs.   Baseline: see objective, increased pain with all movements Goal status: IN PROGRESS  4.  Patient will demonstrate  improved functional strength as demonstrated by 5x STS < 14 seconds. Baseline: 22 seconds Goal status: IN PROGRESS  5.  Patient will report <20% impairment on Oswestry and MCID on lumbar FOTO to demonstrate improved functional ability.  Baseline: 38% disability on Oswestry Goal status: IN PROGRESS   6.  Patient will tolerate 30 min of standing, sitting, or walking without increased pain to perform job duties.  Baseline: increased pain after 5 min Goal status: IN PROGRESS  7.  Patient will be able to lift 20lbs without increased back pain.   Baseline: lifting t-shirts increases pain  Goal status: IN PROGRESS  8. Patient will demonstrate full pain free cervical ROM to perform job activities.  Baseline: see objective.  Goal status: IN PROGRESS  PLAN: PT FREQUENCY: 1-2x/week  PT DURATION: 4 weeks - due to workman's comp limit of 7 visits.  May need to be extended.   PLANNED INTERVENTIONS: Therapeutic exercises, Therapeutic activity, Neuromuscular re-education, Balance training, Gait training, Patient/Family education, Self Care, Joint mobilization, Aquatic Therapy, Dry Needling, Electrical stimulation, Spinal mobilization, Cryotherapy, Moist heat, Traction, Manual therapy, and Re-evaluation.  PLAN FOR NEXT SESSION: FOTO, start neutral spine exercises, manual therapy, modalities PRN   Rennie Natter, PT, DPT  12/16/2021, 6:20 PM

## 2021-12-18 ENCOUNTER — Ambulatory Visit: Payer: PRIVATE HEALTH INSURANCE | Admitting: Physical Therapy

## 2021-12-18 ENCOUNTER — Encounter: Payer: Self-pay | Admitting: Physical Therapy

## 2021-12-18 DIAGNOSIS — M5441 Lumbago with sciatica, right side: Secondary | ICD-10-CM

## 2021-12-18 DIAGNOSIS — M62830 Muscle spasm of back: Secondary | ICD-10-CM

## 2021-12-18 DIAGNOSIS — M5415 Radiculopathy, thoracolumbar region: Secondary | ICD-10-CM

## 2021-12-18 DIAGNOSIS — M546 Pain in thoracic spine: Secondary | ICD-10-CM

## 2021-12-18 NOTE — Therapy (Signed)
OUTPATIENT PHYSICAL THERAPY Treatment Note   Patient Name: Natasha Horton MRN: [id number] DOB:01/22/1976, 46 y.o., female Today's Date: 12/18/2021   PT End of Session - 12/18/21 1102     Visit Number 3    Number of Visits 7    Date for PT Re-Evaluation 01/08/22    Authorization Type Workman's Comp    Authorization - Visit Number 2    Authorization - Number of Visits 7    PT Start Time 1100    PT Stop Time 1145    PT Time Calculation (min) 45 min    Activity Tolerance Patient tolerated treatment well    Behavior During Therapy WFL for tasks assessed/performed             Past Medical History:  Diagnosis Date   Sarcoidosis of lung ([place])    Uterine cancer ([place])    History reviewed. No pertinent surgical history. There are no problems to display for this patient.   PCP: not in system  REFERRING PROVIDER: Drucilla Chalet, NP  REFERRING DIAG: S29.011A (ICD-10-CM) - Strain of muscle and tendon of front wall of thorax, initial encounter S29.012A (ICD-10-CM) - Strain of muscle and tendon of back wall of thorax, initial encounter S20.121A (ICD-10-CM) - Blister (nonthermal) of breast, right breast, initial encounter S20.211A (ICD-10-CM) - Contusion of right front wall of thorax, initial encounter S39.011A (ICD-10-CM) - Strain of muscle, fascia and tendon of abdomen, initial encounter S39.012A (ICD-10-CM) - Strain of muscle, fascia and tendon of lower back, initial encounter R20.2 (ICD-10-CM) - Paresthesia of skin  Rationale for Evaluation and Treatment Rehabilitation  THERAPY DIAG:  Acute midline low back pain with right-sided sciatica  Pain in thoracic spine  Muscle spasm of back  Radiculopathy, thoracolumbar region  ONSET DATE: 11/19/2021  SUBJECTIVE:                                                                                                                                                                                           SUBJECTIVE STATEMENT: Neck is not  as tight as it was but pain is the same.   Back is fine, still stiff as get up, no pain while moving.     PERTINENT HISTORY:  Lung Sarcoidosis; abnormal ECG, chest pain  (resolved), anxiety, past history uterine cancer, gall bladder removed  PAIN:  Are you having pain? Yes: NPRS scale: 7/10 Pain location: neck, but feels looser, 1/10 back   Pain description: burning, aching, gets cramping in upper back with using arms Aggravating factors: sitting prolonged times, walking - constantly has to change position Relieving factors: laying down   PRECAUTIONS: Back  work  restrictions: no lifting more than 5 lbs, no lifting hands over head.  History of Cancer.   WEIGHT BEARING RESTRICTIONS No  FALLS:  Has patient fallen in last 6 months? No  LIVING ENVIRONMENT: Lives with: lives with their spouse and lives with their son Lives in: [place]/apartment Stairs: Yes: External: 8 steps; on right going up Has following equipment at home: None  OCCUPATION: Nurse at [employer]   PLOF: Independent  PATIENT GOALS improve pain   OBJECTIVE:   DIAGNOSTIC FINDINGS:  None available for review   PATIENT SURVEYS:  Modified Oswestry 38%= moderate disability  FOTO TBA  SCREENING FOR RED FLAGS: Bowel or bladder incontinence: No Spinal tumors: NA Cauda equina syndrome: No Compression fracture: No imaging Abdominal aneurysm: NA  COGNITION:  Overall cognitive status: Within functional limits for tasks assessed     SENSATION: Decreased R UE sensation along C5-6 dermatomes, also along R L3-5 dermatomes 50-75%   MUSCLE LENGTH: Hamstrings: Right 80 deg; Left 80 deg ely's test: increased pain R prone knee flexion into groin, R tighter than L.   POSTURE: No Significant postural limitations  PALPATION: Diffuse tenderness throughout thoracic and lumbar paraspinals, pain with PA mob from T2 to sacrum, reports increased tenderness T6-T10, bil QL, R glut med, R SIJ, R piriformis.   CERVICAL ROM:  (12/16/2021)  AROM 12/16/2021  Cervical Flexion 50*  Cervical Extension 50*  Cervical Rotation to Left 60*   Cervical Rotation to Right 75* sounds like sandpaper  Left Sidebend 22*   Right Sidebend 25*   *pain.   CERVICAL SPECIAL TESTS:  Spurling's test: Negative, Distraction test: Positive, and Sharp pursor's test: Negative  LUMBAR ROM:   Active  A/PROM  eval  Flexion To knees, increases pain radiating to R going  Extension 50%, inc pain base of spine  Left lateral flexion Inc pain to R groin 50%  Right lateral flexion 50%, Inc pain upper back L side  Right rotation Pain shoots up back 50%  Left rotation 50% feels it in L shoulder, popping.    (Blank rows = not tested)  LOWER EXTREMITY ROM:   tightness and pain with all R hip movements.  L hip WNL   LOWER EXTREMITY MMT:    MMT Right eval Left eval  Hip flexion 5 5* pain  Hip extension    Hip abduction 5 5  Hip adduction 5 5  Knee flexion 5 5  Knee extension    Ankle dorsiflexion 5 5  Ankle plantarflexion     (Blank rows = not tested)  Upper Extremity MMT:  5/5 bil UE strength, but increased pain in thoracic spine with resistance.    LUMBAR SPECIAL TESTS:  Straight leg raise test: Negative and FABER test: Positive R hip with pain radiating to R groin  FUNCTIONAL TESTS:  5 times sit to stand: 22 seconds  GAIT: Distance walked: 50 Assistive device utilized: None Level of assistance: Complete Independence Comments: no significant deviation or device, increased pain with prolonged walking.   TODAY'S TREATMENT  12/18/2021 Therapeutic Exercise: to improve strength and mobility.  Demo, verbal and tactile cues throughout for technique. Nustep L5 x 5 min  - Seated Cervical Retraction  10 reps  - Seated Scapular Retraction  10 reps - 3 sec hold - Standing Backward Shoulder Rolls 10 reps - Cervical Extension AROM with Strap  10 reps - Seated Assisted Cervical Rotation with  10 reps to side of preference - Seated  Gentle Upper Trapezius Stretch  - 5  reps bil - Gentle Levator Scapulae Stretch  5 reps bil - Sidelying Open Book Thoracic Lumbar Rotation and Extension  5 reps bil - Standing Thoracic Open Book at Wall  5 reps bil Manual Therapy: to decrease muscle spasm and pain and improve mobility.  STM/TPR to cervical paraspinals, bil UT and levator, mobs to cervical spine grade 1-2, NAGS into rotation, gentle traction.    12/16/2021 Therapeutic Exercise: to improve strength and mobility.  Demo, verbal and tactile cues throughout for technique. Nustep  L 5 x 5 min  Levator and UT stretches, retro shoulder rolls, post manual therapy Manual Therapy: to decrease muscle spasm and pain and improve mobility STM/TPR to cervical paraspinals, bil UT and levator, mobs to cervical spine grade 1-2, NAGS into rotation, gentle traction.  skilled palpation and monitoring during dry needling.  Physical Performance - cervical ROM and assessment.   Modalities:CP to neck in supine with estim (premod) to bil thoracic paraspinals to decrease muscle spasm.     12/09/2021 - see patient education.    PATIENT EDUCATION:  Education details: HEP update Person educated: Patient Education method: Consulting civil engineer, Demonstration, Verbal cues, and Handouts Education comprehension: verbalized understanding and returned demonstration   HOME EXERCISE PROGRAM: Access Code: [place]  ASSESSMENT:  CLINICAL IMPRESSION: Natasha Horton reports improvement in low back pain but continues to have more neck pain, but muscles are less tight, and did not have excessive soreness following DN.  Today focused on progressing HEP for postural strengthening, thoracic and cervical mobility, followed by manual therapy to cervical region.  Reported decreased tightness overall, declined modalities today.  Natasha Horton continues to demonstrate potential for improvement and would benefit from continued skilled therapy to address impairments.    OBJECTIVE  IMPAIRMENTS decreased activity tolerance, decreased endurance, decreased mobility, difficulty walking, decreased ROM, hypomobility, increased fascial restrictions, impaired perceived functional ability, increased muscle spasms, impaired flexibility, impaired UE functional use, and pain.   ACTIVITY LIMITATIONS carrying, lifting, bending, sitting, standing, squatting, transfers, reach over head, locomotion level, and caring for others  PARTICIPATION LIMITATIONS: meal prep, cleaning, laundry, shopping, community activity, occupation, and yard work  PERSONAL FACTORS 1-2 comorbidities: history sarcoidosis, cancer, anxiety  are also affecting patient's functional outcome.   REHAB POTENTIAL: Good  CLINICAL DECISION MAKING: Evolving/moderate complexity  EVALUATION COMPLEXITY: Moderate   GOALS: Goals reviewed with patient? Yes  SHORT TERM GOALS: Target date: 12/23/2021   Patient will be independent with initial HEP.  Baseline: no HEP Goal status: IN PROGRESS  2.  Patient will report centralization of radicular symptoms.  Baseline: pain radiates to R groin Goal status: IN PROGRESS 12/16/21- no pain in groin.   3.  Patient will be able to raise arms overhead without increased pain to perform job activities.  Baseline: increases thoracic pain Goal status: IN PROGRESS   LONG TERM GOALS: Target date: 01/08/2022   Patient will be independent with advanced/ongoing HEP to improve outcomes and carryover.  Baseline: no HEP Goal status: IN PROGRESS  2.  Patient will report 75% improvement in low back pain to improve QOL.  Baseline: 8-9/10 thoracic and lumbar pain Goal status: IN PROGRESS  3.  Patient will demonstrate full pain free lumbar ROM to perform ADLs.   Baseline: see objective, increased pain with all movements Goal status: IN PROGRESS  4.  Patient will demonstrate improved functional strength as demonstrated by 5x STS < 14 seconds. Baseline: 22 seconds Goal status: IN PROGRESS  5.   Patient will report <20% impairment on  Oswestry and MCID on lumbar FOTO to demonstrate improved functional ability.  Baseline: 38% disability on Oswestry Goal status: IN PROGRESS   6.  Patient will tolerate 30 min of standing, sitting, or walking without increased pain to perform job duties.  Baseline: increased pain after 5 min Goal status: IN PROGRESS  7.  Patient will be able to lift 20lbs without increased back pain.   Baseline: lifting t-shirts increases pain  Goal status: IN PROGRESS  8. Patient will demonstrate full pain free cervical ROM to perform job activities.  Baseline: see objective.  Goal status: IN PROGRESS  PLAN: PT FREQUENCY: 1-2x/week  PT DURATION: 4 weeks - due to workman's comp limit of 7 visits.  May need to be extended.   PLANNED INTERVENTIONS: Therapeutic exercises, Therapeutic activity, Neuromuscular re-education, Balance training, Gait training, Patient/Family education, Self Care, Joint mobilization, Aquatic Therapy, Dry Needling, Electrical stimulation, Spinal mobilization, Cryotherapy, Moist heat, Traction, Manual therapy, and Re-evaluation.  PLAN FOR NEXT SESSION: FOTO, start neutral spine exercises, manual therapy, modalities PRN   Rennie Natter, PT, DPT  12/18/2021, 11:58 AM

## 2021-12-22 ENCOUNTER — Other Ambulatory Visit (HOSPITAL_COMMUNITY): Payer: Self-pay

## 2021-12-22 MED ORDER — CYCLOBENZAPRINE HCL 5 MG PO TABS
ORAL_TABLET | ORAL | 0 refills | Status: AC
  Filled 2021-12-22: qty 60, 30d supply, fill #0

## 2021-12-22 MED ORDER — MELOXICAM 15 MG PO TABS
ORAL_TABLET | ORAL | 2 refills | Status: AC
  Filled 2021-12-22: qty 60, 60d supply, fill #0

## 2021-12-22 MED ORDER — METHOCARBAMOL 500 MG PO TABS
ORAL_TABLET | ORAL | 2 refills | Status: AC
  Filled 2021-12-22: qty 60, 8d supply, fill #0

## 2021-12-23 ENCOUNTER — Ambulatory Visit: Payer: PRIVATE HEALTH INSURANCE

## 2021-12-23 DIAGNOSIS — M5441 Lumbago with sciatica, right side: Secondary | ICD-10-CM

## 2021-12-23 DIAGNOSIS — M5415 Radiculopathy, thoracolumbar region: Secondary | ICD-10-CM

## 2021-12-23 DIAGNOSIS — M62830 Muscle spasm of back: Secondary | ICD-10-CM

## 2021-12-23 DIAGNOSIS — M546 Pain in thoracic spine: Secondary | ICD-10-CM

## 2021-12-23 NOTE — Therapy (Signed)
OUTPATIENT PHYSICAL THERAPY Treatment Note   Patient Name: Natasha Horton MRN: [id number] DOB:[birth year], 46 y.o., female Today's Date: 12/23/2021   PT End of Session - 12/23/21 1409     Visit Number 4    Number of Visits 7    Date for PT Re-Evaluation 01/08/22    Authorization Type Workman's Comp    Authorization - Visit Number 3    Authorization - Number of Visits 7    PT Start Time 7614    PT Stop Time 1402    PT Time Calculation (min) 45 min    Activity Tolerance Patient tolerated treatment well    Behavior During Therapy WFL for tasks assessed/performed              Past Medical History:  Diagnosis Date   Sarcoidosis of lung ([place])    Uterine cancer ([place])    History reviewed. No pertinent surgical history. There are no problems to display for this patient.   PCP: not in system  REFERRING PROVIDER: Drucilla Chalet, NP  REFERRING DIAG: S29.011A (ICD-10-CM) - Strain of muscle and tendon of front wall of thorax, initial encounter S29.012A (ICD-10-CM) - Strain of muscle and tendon of back wall of thorax, initial encounter S20.121A (ICD-10-CM) - Blister (nonthermal) of breast, right breast, initial encounter S20.211A (ICD-10-CM) - Contusion of right front wall of thorax, initial encounter S39.011A (ICD-10-CM) - Strain of muscle, fascia and tendon of abdomen, initial encounter S39.012A (ICD-10-CM) - Strain of muscle, fascia and tendon of lower back, initial encounter R20.2 (ICD-10-CM) - Paresthesia of skin  Rationale for Evaluation and Treatment Rehabilitation  THERAPY DIAG:  Acute midline low back pain with right-sided sciatica  Pain in thoracic spine  Muscle spasm of back  Radiculopathy, thoracolumbar region  ONSET DATE: 11/19/2021  SUBJECTIVE:                                                                                                                                                                                           SUBJECTIVE STATEMENT: Pt  reports shoulders and neck hurting, also having a headache today from neck pain.  PERTINENT HISTORY:  Lung Sarcoidosis; abnormal ECG, chest pain  (resolved), anxiety, past history uterine cancer, gall bladder removed  PAIN:  Are you having pain? Yes: NPRS scale: 8/10 Pain location: neck, but feels looser, 1/10 back   Pain description: burning, aching, gets cramping in upper back with using arms Aggravating factors: sitting prolonged times, walking - constantly has to change position Relieving factors: laying down   PRECAUTIONS: Back  work restrictions: no lifting more than 10 lbs (updated 8/7) , no lifting hands over head.  History of Cancer.   WEIGHT BEARING RESTRICTIONS No  FALLS:  Has patient fallen in last 6 months? No  LIVING ENVIRONMENT: Lives with: lives with their spouse and lives with their son Lives in: [place]/apartment Stairs: Yes: External: 8 steps; on right going up Has following equipment at home: None  OCCUPATION: Nurse at [employer]   PLOF: Independent  PATIENT GOALS improve pain   OBJECTIVE:   DIAGNOSTIC FINDINGS:  None available for review   PATIENT SURVEYS:  Modified Oswestry 38%= moderate disability  FOTO 55.51%  SCREENING FOR RED FLAGS: Bowel or bladder incontinence: No Spinal tumors: NA Cauda equina syndrome: No Compression fracture: No imaging Abdominal aneurysm: NA  COGNITION:  Overall cognitive status: Within functional limits for tasks assessed     SENSATION: Decreased R UE sensation along C5-6 dermatomes, also along R L3-5 dermatomes 50-75%   MUSCLE LENGTH: Hamstrings: Right 80 deg; Left 80 deg ely's test: increased pain R prone knee flexion into groin, R tighter than L.   POSTURE: No Significant postural limitations  PALPATION: Diffuse tenderness throughout thoracic and lumbar paraspinals, pain with PA mob from T2 to sacrum, reports increased tenderness T6-T10, bil QL, R glut med, R SIJ, R piriformis.   CERVICAL ROM:  (12/16/2021)  AROM 12/16/2021  Cervical Flexion 50*  Cervical Extension 50*  Cervical Rotation to Left 60*   Cervical Rotation to Right 75* sounds like sandpaper  Left Sidebend 22*   Right Sidebend 25*   *pain.   CERVICAL SPECIAL TESTS:  Spurling's test: Negative, Distraction test: Positive, and Sharp pursor's test: Negative  LUMBAR ROM:   Active  A/PROM  eval  Flexion To knees, increases pain radiating to R going  Extension 50%, inc pain base of spine  Left lateral flexion Inc pain to R groin 50%  Right lateral flexion 50%, Inc pain upper back L side  Right rotation Pain shoots up back 50%  Left rotation 50% feels it in L shoulder, popping.    (Blank rows = not tested)  LOWER EXTREMITY ROM:   tightness and pain with all R hip movements.  L hip WNL   LOWER EXTREMITY MMT:    MMT Right eval Left eval  Hip flexion 5 5* pain  Hip extension    Hip abduction 5 5  Hip adduction 5 5  Knee flexion 5 5  Knee extension    Ankle dorsiflexion 5 5  Ankle plantarflexion     (Blank rows = not tested)  Upper Extremity MMT:  5/5 bil UE strength, but increased pain in thoracic spine with resistance.    LUMBAR SPECIAL TESTS:  Straight leg raise test: Negative and FABER test: Positive R hip with pain radiating to R groin  FUNCTIONAL TESTS:  5 times sit to stand: 22 seconds  GAIT: Distance walked: 50 Assistive device utilized: None Level of assistance: Complete Independence Comments: no significant deviation or device, increased pain with prolonged walking.   TODAY'S TREATMENT  12/23/21 Therapeutic Exercise: Nustep L4x70mn Seated thoracic 10x Seated cervical AAROM w/ pillowcase x 10 Standing row yellow TB x 10 Standing shld extension yellow TB x 10  Manual Therapy: STM to B cervical and thoracic PS, UT, suboccipitals  12/18/2021 Therapeutic Exercise: to improve strength and mobility.  Demo, verbal and tactile cues throughout for technique. Nustep L5 x 5 min  - Seated  Cervical Retraction  10 reps  - Seated Scapular Retraction  10 reps - 3 sec hold - Standing Backward Shoulder Rolls 10 reps - Cervical Extension  AROM with Strap  10 reps - Seated Assisted Cervical Rotation with  10 reps to side of preference - Seated Gentle Upper Trapezius Stretch  - 5 reps bil - Gentle Levator Scapulae Stretch  5 reps bil - Sidelying Open Book Thoracic Lumbar Rotation and Extension  5 reps bil - Standing Thoracic Open Book at Wall  5 reps bil Manual Therapy: to decrease muscle spasm and pain and improve mobility.  STM/TPR to cervical paraspinals, bil UT and levator, mobs to cervical spine grade 1-2, NAGS into rotation, gentle traction.    12/16/2021 Therapeutic Exercise: to improve strength and mobility.  Demo, verbal and tactile cues throughout for technique. Nustep  L 5 x 5 min  Levator and UT stretches, retro shoulder rolls, post manual therapy Manual Therapy: to decrease muscle spasm and pain and improve mobility STM/TPR to cervical paraspinals, bil UT and levator, mobs to cervical spine grade 1-2, NAGS into rotation, gentle traction.  skilled palpation and monitoring during dry needling.  Physical Performance - cervical ROM and assessment.   Modalities:CP to neck in supine with estim (premod) to bil thoracic paraspinals to decrease muscle spasm.     12/09/2021 - see patient education.    PATIENT EDUCATION:  Education details: HEP update Person educated: Patient Education method: Consulting civil engineer, Demonstration, Verbal cues, and Handouts Education comprehension: verbalized understanding and returned demonstration   HOME EXERCISE PROGRAM: Access Code: [place]  ASSESSMENT:  CLINICAL IMPRESSION:  Pt continues to note most pain along cervical spine and no issues w/ lower back. We did incorporate postural strengthening to improve alignment of spine and reduce strain. Updated HEP and provided instruction on setup, sets/reps, etc with new exercises. Finished w/ MT to  decrease muscle tension and reduce tension headache. Pt did report most resolution of headache after manual.   OBJECTIVE IMPAIRMENTS decreased activity tolerance, decreased endurance, decreased mobility, difficulty walking, decreased ROM, hypomobility, increased fascial restrictions, impaired perceived functional ability, increased muscle spasms, impaired flexibility, impaired UE functional use, and pain.   ACTIVITY LIMITATIONS carrying, lifting, bending, sitting, standing, squatting, transfers, reach over head, locomotion level, and caring for others  PARTICIPATION LIMITATIONS: meal prep, cleaning, laundry, shopping, community activity, occupation, and yard work  PERSONAL FACTORS 1-2 comorbidities: history sarcoidosis, cancer, anxiety  are also affecting patient's functional outcome.   REHAB POTENTIAL: Good  CLINICAL DECISION MAKING: Evolving/moderate complexity  EVALUATION COMPLEXITY: Moderate   GOALS: Goals reviewed with patient? Yes  SHORT TERM GOALS: Target date: 12/23/2021   Patient will be independent with initial HEP.  Baseline: no HEP Goal status: MET - 12/23/21  2.  Patient will report centralization of radicular symptoms.  Baseline: pain radiates to R groin Goal status: IN PROGRESS 12/16/21- no pain in groin.   3.  Patient will be able to raise arms overhead without increased pain to perform job activities.  Baseline: increases thoracic pain Goal status: IN PROGRESS   LONG TERM GOALS: Target date: 01/08/2022   Patient will be independent with advanced/ongoing HEP to improve outcomes and carryover.  Baseline: no HEP Goal status: IN PROGRESS  2.  Patient will report 75% improvement in low back pain to improve QOL.  Baseline: 8-9/10 thoracic and lumbar pain Goal status: IN PROGRESS  3.  Patient will demonstrate full pain free lumbar ROM to perform ADLs.   Baseline: see objective, increased pain with all movements Goal status: IN PROGRESS  4.  Patient will demonstrate  improved functional strength as demonstrated by 5x STS < 14 seconds. Baseline: 22 seconds Goal  status: IN PROGRESS  5.  Patient will report <20% impairment on Oswestry and MCID on lumbar FOTO to demonstrate improved functional ability.  Baseline: 38% disability on Oswestry Goal status: IN PROGRESS   6.  Patient will tolerate 30 min of standing, sitting, or walking without increased pain to perform job duties.  Baseline: increased pain after 5 min Goal status: IN PROGRESS  7.  Patient will be able to lift 20lbs without increased back pain.   Baseline: lifting t-shirts increases pain  Goal status: IN PROGRESS  8. Patient will demonstrate full pain free cervical ROM to perform job activities.  Baseline: see objective.  Goal status: IN PROGRESS  PLAN: PT FREQUENCY: 1-2x/week  PT DURATION: 4 weeks - due to workman's comp limit of 7 visits.  May need to be extended.   PLANNED INTERVENTIONS: Therapeutic exercises, Therapeutic activity, Neuromuscular re-education, Balance training, Gait training, Patient/Family education, Self Care, Joint mobilization, Aquatic Therapy, Dry Needling, Electrical stimulation, Spinal mobilization, Cryotherapy, Moist heat, Traction, Manual therapy, and Re-evaluation.  PLAN FOR NEXT SESSION:  start neutral spine exercises, manual therapy, modalities PRN   Artist Pais, PTA 12/23/2021, 2:09 PM

## 2021-12-29 ENCOUNTER — Ambulatory Visit: Payer: PRIVATE HEALTH INSURANCE

## 2022-01-01 ENCOUNTER — Ambulatory Visit: Payer: PRIVATE HEALTH INSURANCE | Admitting: Physical Therapy

## 2022-01-01 ENCOUNTER — Encounter: Payer: Self-pay | Admitting: Physical Therapy

## 2022-01-01 DIAGNOSIS — M5415 Radiculopathy, thoracolumbar region: Secondary | ICD-10-CM

## 2022-01-01 DIAGNOSIS — M62830 Muscle spasm of back: Secondary | ICD-10-CM

## 2022-01-01 DIAGNOSIS — M5441 Lumbago with sciatica, right side: Secondary | ICD-10-CM | POA: Diagnosis not present

## 2022-01-01 DIAGNOSIS — M546 Pain in thoracic spine: Secondary | ICD-10-CM

## 2022-01-01 NOTE — Therapy (Signed)
OUTPATIENT PHYSICAL THERAPY Treatment Note   Patient Name: Natasha Horton MRN: [id number] DOB:Jan 26, 1976, 46 y.o., female Today's Date: 01/01/2022   PT End of Session - 01/01/22 1105     Visit Number 5    Number of Visits 7    Date for PT Re-Evaluation 01/08/22    Authorization Type Workman's Comp    Authorization - Visit Number 3    Authorization - Number of Visits 7    PT Start Time 1007    PT Stop Time 1150    PT Time Calculation (min) 47 min    Activity Tolerance Patient tolerated treatment well    Behavior During Therapy WFL for tasks assessed/performed              Past Medical History:  Diagnosis Date   Sarcoidosis of lung ([place])    Uterine cancer ([place])    History reviewed. No pertinent surgical history. There are no problems to display for this patient.   PCP: not in system  REFERRING PROVIDER: Drucilla Chalet, NP  REFERRING DIAG: S29.011A (ICD-10-CM) - Strain of muscle and tendon of front wall of thorax, initial encounter S29.012A (ICD-10-CM) - Strain of muscle and tendon of back wall of thorax, initial encounter S20.121A (ICD-10-CM) - Blister (nonthermal) of breast, right breast, initial encounter S20.211A (ICD-10-CM) - Contusion of right front wall of thorax, initial encounter S39.011A (ICD-10-CM) - Strain of muscle, fascia and tendon of abdomen, initial encounter S39.012A (ICD-10-CM) - Strain of muscle, fascia and tendon of lower back, initial encounter R20.2 (ICD-10-CM) - Paresthesia of skin  Rationale for Evaluation and Treatment Rehabilitation  THERAPY DIAG:  Acute midline low back pain with right-sided sciatica  Pain in thoracic spine  Muscle spasm of back  Radiculopathy, thoracolumbar region  ONSET DATE: 11/19/2021  SUBJECTIVE:                                                                                                                                                                                           SUBJECTIVE STATEMENT: Pt  reports shoulders and neck hurt and feel stiff after doing too much at work.  After work was 10/10 had to take muscle relaxer and was getting exercise.    PERTINENT HISTORY:  Lung Sarcoidosis; abnormal ECG, chest pain  (resolved), anxiety, past history uterine cancer, gall bladder removed  PAIN:  Are you having pain? Yes: NPRS scale: 3-4/10 Pain location: neck, but feels looser, 10/10 after work   Pain description: burning, aching, gets cramping in upper back with using arms Aggravating factors: sitting prolonged times, walking - constantly has to change position Relieving factors: laying down   PRECAUTIONS:  Back  work restrictions: no lifting more than 10 lbs (updated 8/7) , no lifting hands over head.  History of Cancer.   WEIGHT BEARING RESTRICTIONS No  FALLS:  Has patient fallen in last 6 months? No  LIVING ENVIRONMENT: Lives with: lives with their spouse and lives with their son Lives in: [place]/apartment Stairs: Yes: External: 8 steps; on right going up Has following equipment at home: None  OCCUPATION: Nurse at [employer]   PLOF: Independent  PATIENT GOALS improve pain   OBJECTIVE:   DIAGNOSTIC FINDINGS:  None available for review   PATIENT SURVEYS:  Modified Oswestry 38%= moderate disability  FOTO 55.51%  SCREENING FOR RED FLAGS: Bowel or bladder incontinence: No Spinal tumors: NA Cauda equina syndrome: No Compression fracture: No imaging Abdominal aneurysm: NA  COGNITION:  Overall cognitive status: Within functional limits for tasks assessed     SENSATION: Decreased R UE sensation along C5-6 dermatomes, also along R L3-5 dermatomes 50-75%   MUSCLE LENGTH: Hamstrings: Right 80 deg; Left 80 deg ely's test: increased pain R prone knee flexion into groin, R tighter than L.   POSTURE: No Significant postural limitations  PALPATION: Diffuse tenderness throughout thoracic and lumbar paraspinals, pain with PA mob from T2 to sacrum, reports increased  tenderness T6-T10, bil QL, R glut med, R SIJ, R piriformis.   CERVICAL ROM: (12/16/2021)  AROM 12/16/2021  Cervical Flexion 50*  Cervical Extension 50*  Cervical Rotation to Left 60*   Cervical Rotation to Right 75* sounds like sandpaper  Left Sidebend 22*   Right Sidebend 25*   *pain.   CERVICAL SPECIAL TESTS:  Spurling's test: Negative, Distraction test: Positive, and Sharp pursor's test: Negative  LUMBAR ROM:   Active  A/PROM  eval  Flexion To knees, increases pain radiating to R going  Extension 50%, inc pain base of spine  Left lateral flexion Inc pain to R groin 50%  Right lateral flexion 50%, Inc pain upper back L side  Right rotation Pain shoots up back 50%  Left rotation 50% feels it in L shoulder, popping.    (Blank rows = not tested)  LOWER EXTREMITY ROM:   tightness and pain with all R hip movements.  L hip WNL   LOWER EXTREMITY MMT:    MMT Right eval Left eval  Hip flexion 5 5* pain  Hip extension    Hip abduction 5 5  Hip adduction 5 5  Knee flexion 5 5  Knee extension    Ankle dorsiflexion 5 5  Ankle plantarflexion     (Blank rows = not tested)  Upper Extremity MMT:  5/5 bil UE strength, but increased pain in thoracic spine with resistance.    LUMBAR SPECIAL TESTS:  Straight leg raise test: Negative and FABER test: Positive R hip with pain radiating to R groin  FUNCTIONAL TESTS:  5 times sit to stand: 22 seconds  GAIT: Distance walked: 50 Assistive device utilized: None Level of assistance: Complete Independence Comments: no significant deviation or device, increased pain with prolonged walking.   TODAY'S TREATMENT  01/01/2022 Therapeutic Exercise: to improve strength and mobility.  Nustep L5 x 6 min  Open books at wall x 10 bil  Wall push-ups x 15 Wall angels x 15 In supine: Thoracic mobs on foam roller -crosswise Lengthwise on foam roller:  Shoulder flexion  x 10 bil  Y-arms x 15 Manual Therapy: to decrease muscle spasm and pain and  improve mobility PA mobs thoracic spine, STM/TPR to L levator, upper  trap, cervical paraspinals, UPA mobs thoracic and cervical spine; skilled palpation and monitoring during dry needling. Trigger Point Dry-Needling  Treatment instructions: Expect mild to moderate muscle soreness. S/S of pneumothorax if dry needled over a lung field, and to seek immediate medical attention should they occur. Patient verbalized understanding of these instructions and education.  Patient Consent Given: Yes Education handout provided: Previously provided Muscles treated: L levator scapulae and UT Treatment response/outcome: Twitch Response Elicited and Palpable Increase in Muscle Length  12/23/21 Therapeutic Exercise: Nustep L4x2mn Seated thoracic 10x Seated cervical AAROM w/ pillowcase x 10 Standing row yellow TB x 10 Standing shld extension yellow TB x 10  Manual Therapy: STM to B cervical and thoracic PS, UT, suboccipitals  12/18/2021 Therapeutic Exercise: to improve strength and mobility.  Demo, verbal and tactile cues throughout for technique. Nustep L5 x 5 min  - Seated Cervical Retraction  10 reps  - Seated Scapular Retraction  10 reps - 3 sec hold - Standing Backward Shoulder Rolls 10 reps - Cervical Extension AROM with Strap  10 reps - Seated Assisted Cervical Rotation with  10 reps to side of preference - Seated Gentle Upper Trapezius Stretch  - 5 reps bil - Gentle Levator Scapulae Stretch  5 reps bil - Sidelying Open Book Thoracic Lumbar Rotation and Extension  5 reps bil - Standing Thoracic Open Book at Wall  5 reps bil Manual Therapy: to decrease muscle spasm and pain and improve mobility.  STM/TPR to cervical paraspinals, bil UT and levator, mobs to cervical spine grade 1-2, NAGS into rotation, gentle traction.     PATIENT EDUCATION:  Education details: continue HEP Person educated: Patient Education method: Explanation Education comprehension: verbalized understanding and returned  demonstration   HOME EXERCISE PROGRAM: Access Code: [email or web address] ASSESSMENT:  CLINICAL IMPRESSION:  Patient reports most of symptoms have now localized to L levator scapulae, with increased pain, muscle spasm and headaches at end of work day.  Focused therex on scapular mobilization and strengthening, followed by manual therapy again focusing on thoracic spine and periscapular musculature including levator scapulae.  Natasha Horton continues to demonstrate potential for improvement and would benefit from continued skilled therapy to address impairments.      OBJECTIVE IMPAIRMENTS decreased activity tolerance, decreased endurance, decreased mobility, difficulty walking, decreased ROM, hypomobility, increased fascial restrictions, impaired perceived functional ability, increased muscle spasms, impaired flexibility, impaired UE functional use, and pain.   ACTIVITY LIMITATIONS carrying, lifting, bending, sitting, standing, squatting, transfers, reach over head, locomotion level, and caring for others  PARTICIPATION LIMITATIONS: meal prep, cleaning, laundry, shopping, community activity, occupation, and yard work  PERSONAL FACTORS 1-2 comorbidities: history sarcoidosis, cancer, anxiety  are also affecting patient's functional outcome.   REHAB POTENTIAL: Good  CLINICAL DECISION MAKING: Evolving/moderate complexity  EVALUATION COMPLEXITY: Moderate   GOALS: Goals reviewed with patient? Yes  SHORT TERM GOALS: Target date: 12/23/2021   Patient will be independent with initial HEP.  Baseline: no HEP Goal status: MET - 12/23/21  2.  Patient will report centralization of radicular symptoms.  Baseline: pain radiates to R groin Goal status: MET 12/16/21- no pain in groin.  01/01/2022- no pain or radicular symptoms for low back.   3.  Patient will be able to raise arms overhead without increased pain to perform job activities.  Baseline: increases thoracic pain Goal status: IN PROGRESS   LONG  TERM GOALS: Target date: 01/08/2022   Patient will be independent with advanced/ongoing HEP to improve outcomes and carryover.  Baseline:  no HEP Goal status: IN PROGRESS  2.  Patient will report 75% improvement in low back pain to improve QOL.  Baseline: 8-9/10 thoracic and lumbar pain Goal status: IN PROGRESS  3.  Patient will demonstrate full pain free lumbar ROM to perform ADLs.   Baseline: see objective, increased pain with all movements Goal status: IN PROGRESS  4.  Patient will demonstrate improved functional strength as demonstrated by 5x STS < 14 seconds. Baseline: 22 seconds Goal status: IN PROGRESS  5.  Patient will report <20% impairment on Oswestry and MCID on lumbar FOTO to demonstrate improved functional ability.  Baseline: 38% disability on Oswestry Goal status: IN PROGRESS   6.  Patient will tolerate 30 min of standing, sitting, or walking without increased pain to perform job duties.  Baseline: increased pain after 5 min Goal status: IN PROGRESS  7.  Patient will be able to lift 20lbs without increased back pain.   Baseline: lifting t-shirts increases pain  Goal status: IN PROGRESS  8. Patient will demonstrate full pain free cervical ROM to perform job activities.  Baseline: see objective.  Goal status: IN PROGRESS  PLAN: PT FREQUENCY: 1-2x/week  PT DURATION: 4 weeks - due to workman's comp limit of 7 visits.  May need to be extended.   PLANNED INTERVENTIONS: Therapeutic exercises, Therapeutic activity, Neuromuscular re-education, Balance training, Gait training, Patient/Family education, Self Care, Joint mobilization, Aquatic Therapy, Dry Needling, Electrical stimulation, Spinal mobilization, Cryotherapy, Moist heat, Traction, Manual therapy, and Re-evaluation.  PLAN FOR NEXT SESSION:  start neutral spine exercises, manual therapy, modalities PRN   Rennie Natter, PT, DPT 01/01/2022, 11:58 AM

## 2022-01-05 ENCOUNTER — Encounter: Payer: Self-pay | Admitting: Physical Therapy

## 2022-01-05 ENCOUNTER — Ambulatory Visit: Payer: PRIVATE HEALTH INSURANCE | Admitting: Physical Therapy

## 2022-01-05 DIAGNOSIS — M62830 Muscle spasm of back: Secondary | ICD-10-CM

## 2022-01-05 DIAGNOSIS — M5441 Lumbago with sciatica, right side: Secondary | ICD-10-CM

## 2022-01-05 DIAGNOSIS — M5415 Radiculopathy, thoracolumbar region: Secondary | ICD-10-CM

## 2022-01-05 DIAGNOSIS — M546 Pain in thoracic spine: Secondary | ICD-10-CM

## 2022-01-05 NOTE — Therapy (Signed)
OUTPATIENT PHYSICAL THERAPY Treatment Note   Patient Name: Natasha Horton MRN: [id number] DOB:November 05, 1975, 46 y.o., female Today's Date: 01/05/2022   PT End of Session - 01/05/22 1313     Visit Number 6    Number of Visits 11    Date for PT Re-Evaluation 01/25/22    Authorization Type [employer]'s [employer]    Authorization - Visit Number 5    Authorization - Number of Visits 11    PT Start Time 6578    PT Stop Time 1402    PT Time Calculation (min) 47 min    Activity Tolerance Patient tolerated treatment well    Behavior During Therapy WFL for tasks assessed/performed              Past Medical History:  Diagnosis Date   Sarcoidosis of lung ([place])    Uterine cancer ([place])    History reviewed. No pertinent surgical history. There are no problems to display for this patient.   PCP: not in system  REFERRING PROVIDER: Drucilla Chalet, NP  REFERRING DIAG: S29.011A (ICD-10-CM) - Strain of muscle and tendon of front wall of thorax, initial encounter S29.012A (ICD-10-CM) - Strain of muscle and tendon of back wall of thorax, initial encounter S20.121A (ICD-10-CM) - Blister (nonthermal) of breast, right breast, initial encounter S20.211A (ICD-10-CM) - Contusion of right front wall of thorax, initial encounter S39.011A (ICD-10-CM) - Strain of muscle, fascia and tendon of abdomen, initial encounter S39.012A (ICD-10-CM) - Strain of muscle, fascia and tendon of lower back, initial encounter R20.2 (ICD-10-CM) - Paresthesia of skin  Rationale for Evaluation and Treatment Rehabilitation  THERAPY DIAG:  Acute midline low back pain with right-sided sciatica  Pain in thoracic spine  Muscle spasm of back  Radiculopathy, thoracolumbar region  ONSET DATE: 11/19/2021  SUBJECTIVE:                                                                                                                                                                                           SUBJECTIVE STATEMENT: Pt  reports she has a lot of energy today.   Had some soreness day after needling, but since then has been wonderful, headache has gone away, started some gentle lifting.   Feels a little bit in scapula, not radiating up neck anymore.    PERTINENT HISTORY:  Lung Sarcoidosis; abnormal ECG, chest pain  (resolved), anxiety, past history uterine cancer, gall bladder removed  PAIN:  Are you having pain? Yes: NPRS scale: 1/10 Pain location: upper back, 0/10 low back      PRECAUTIONS: Back  work restrictions: no lifting more than 10 lbs (updated 8/7) , no  lifting hands over head.  History of Cancer.   WEIGHT BEARING RESTRICTIONS No  FALLS:  Has patient fallen in last 6 months? No  LIVING ENVIRONMENT: Lives with: lives with their spouse and lives with their son Lives in: [place]/apartment Stairs: Yes: External: 8 steps; on right going up Has following equipment at home: None  OCCUPATION: Nurse at [employer]   PLOF: Independent  PATIENT GOALS improve pain   OBJECTIVE:   DIAGNOSTIC FINDINGS:  None available for review   PATIENT SURVEYS:  Modified Oswestry 38%= moderate disability  FOTO 55.51%  SCREENING FOR RED FLAGS: Bowel or bladder incontinence: No Spinal tumors: NA Cauda equina syndrome: No Compression fracture: No imaging Abdominal aneurysm: NA  COGNITION:  Overall cognitive status: Within functional limits for tasks assessed     SENSATION: Decreased R UE sensation along C5-6 dermatomes, also along R L3-5 dermatomes 50-75%   MUSCLE LENGTH: Hamstrings: Right 80 deg; Left 80 deg ely's test: increased pain R prone knee flexion into groin, R tighter than L.   POSTURE: No Significant postural limitations  PALPATION: Diffuse tenderness throughout thoracic and lumbar paraspinals, pain with PA mob from T2 to sacrum, reports increased tenderness T6-T10, bil QL, R glut med, R SIJ, R piriformis.   CERVICAL ROM: (12/16/2021)  AROM 12/16/2021  Cervical Flexion 50*  Cervical  Extension 50*  Cervical Rotation to Left 60*   Cervical Rotation to Right 75* sounds like sandpaper  Left Sidebend 22*   Right Sidebend 25*   *pain.   CERVICAL SPECIAL TESTS:  Spurling's test: Negative, Distraction test: Positive, and Sharp pursor's test: Negative  LUMBAR ROM:   Active  A/PROM  eval  Flexion To knees, increases pain radiating to R going  Extension 50%, inc pain base of spine  Left lateral flexion Inc pain to R groin 50%  Right lateral flexion 50%, Inc pain upper back L side  Right rotation Pain shoots up back 50%  Left rotation 50% feels it in L shoulder, popping.    (Blank rows = not tested)  LOWER EXTREMITY ROM:   tightness and pain with all R hip movements.  L hip WNL   LOWER EXTREMITY MMT:    MMT Right eval Left eval  Hip flexion 5 5* pain  Hip extension    Hip abduction 5 5  Hip adduction 5 5  Knee flexion 5 5  Knee extension    Ankle dorsiflexion 5 5  Ankle plantarflexion     (Blank rows = not tested)  Upper Extremity MMT:  5/5 bil UE strength, but increased pain in thoracic spine with resistance.    LUMBAR SPECIAL TESTS:  Straight leg raise test: Negative and FABER test: Positive R hip with pain radiating to R groin  FUNCTIONAL TESTS:  5 times sit to stand: 22 seconds  GAIT: Distance walked: 50 Assistive device utilized: None Level of assistance: Complete Independence Comments: no significant deviation or device, increased pain with prolonged walking.   TODAY'S TREATMENT  01/05/22 Therapeutic Exercise: to improve strength and mobility.  Demo, verbal and tactile cues throughout for technique. Nustep L5 x 6 min  Bicep curls x 15 RTB Rows RTB x 15  Shoulder extensions RTB x 15 Paloff press RTB x 10 bil  RDLs with bil 5# weights x 10 Dead row to knees with bil 5# weights x 10 - felt more in l/s Overhead press blue weighted ball x 15 Seated hip flexion with blue weighted ball x 15 Quadruped cat/cows x 10  Child  pose  Thread the  needle quadruped x 10 bil  Bird dogs 2 x 10 bil  Manual Therapy: to decrease muscle spasm and pain and improve mobility PA/ R UPA mobs thoracic spine, IASTM with s/s tool to R rhomboids, Scapular mobs R, STM to levator scapulae and cervical paraspinals.   01/01/2022 Therapeutic Exercise: to improve strength and mobility.  Nustep L5 x 6 min  Open books at wall x 10 bil  Wall push-ups x 15 Wall angels x 15 In supine: Thoracic mobs on foam roller -crosswise Lengthwise on foam roller:  Shoulder flexion  x 10 bil  Y-arms x 15 Manual Therapy: to decrease muscle spasm and pain and improve mobility PA mobs thoracic spine, STM/TPR to L levator, upper trap, cervical paraspinals, UPA mobs thoracic and cervical spine; skilled palpation and monitoring during dry needling. Trigger Point Dry-Needling  Treatment instructions: Expect mild to moderate muscle soreness. S/S of pneumothorax if dry needled over a lung field, and to seek immediate medical attention should they occur. Patient verbalized understanding of these instructions and education.  Patient Consent Given: Yes Education handout provided: Previously provided Muscles treated: L levator scapulae and UT Treatment response/outcome: Twitch Response Elicited and Palpable Increase in Muscle Length  12/23/21 Therapeutic Exercise: Nustep L4x38mn Seated thoracic 10x Seated cervical AAROM w/ pillowcase x 10 Standing row yellow TB x 10 Standing shld extension yellow TB x 10  Manual Therapy: STM to B cervical and thoracic PS, UT, suboccipitals    PATIENT EDUCATION:  Education details: HEP update Person educated: Patient Education method: Explanation Education comprehension: verbalized understanding and returned demonstration   [facility] EXERCISE PROGRAM: [facility]  ASSESSMENT:  CLINICAL IMPRESSION: Natasha Horton reports significant improvement in neck pain and headache today, still has some tightness in R  scapula and thoracic spine.  No back pain.  Progressed HEP starting gentle loading of low back with light weights, and overhead presses, as well as continued strengthening for low back and thoracic mobilization, tolerated exercises well without increased pain.  Manual therapy still noted tightness R rhomboids and thoracic spine.   Natasha Horton continues to demonstrate potential for improvement and would benefit from continued skilled therapy to address impairments.      OBJECTIVE IMPAIRMENTS decreased activity tolerance, decreased endurance, decreased mobility, difficulty walking, decreased ROM, hypomobility, increased fascial restrictions, impaired perceived functional ability, increased muscle spasms, impaired flexibility, impaired UE functional use, and pain.   ACTIVITY LIMITATIONS carrying, lifting, bending, sitting, standing, squatting, transfers, reach over head, locomotion level, and caring for others  PARTICIPATION LIMITATIONS: meal prep, cleaning, laundry, shopping, community activity, occupation, and yard work  PERSONAL FACTORS 1-2 comorbidities: history sarcoidosis, cancer, anxiety  are also affecting patient's functional outcome.   REHAB POTENTIAL: Good  CLINICAL DECISION MAKING: Evolving/moderate complexity  EVALUATION COMPLEXITY: Moderate   GOALS: Goals reviewed with patient? Yes  SHORT TERM GOALS: Target date: 12/23/2021   Patient will be independent with initial HEP.  Baseline: no HEP Goal status: MET - 12/23/21  2.  Patient will report centralization of radicular symptoms.  Baseline: pain radiates to R groin Goal status: MET 12/16/21- no pain in groin.  01/01/2022- no pain or radicular symptoms for low back.   3.  Patient will be able to raise arms overhead without increased pain to perform job activities.  Baseline: increases thoracic pain Goal status: IN PROGRESS   LONG TERM GOALS: Target date: 01/08/2022 extended to 01/25/2022   Patient will be independent with  advanced/ongoing HEP  to improve outcomes and carryover.  Baseline: no HEP Goal status: IN PROGRESS  2.  Patient will report 75% improvement in low back pain to improve QOL.  Baseline: 8-9/10 thoracic and lumbar pain Goal status: IN PROGRESS  3.  Patient will demonstrate full pain free lumbar ROM to perform ADLs.   Baseline: see objective, increased pain with all movements Goal status: IN PROGRESS  4.  Patient will demonstrate improved functional strength as demonstrated by 5x STS < 14 seconds. Baseline: 22 seconds Goal status: IN PROGRESS  5.  Patient will report <20% impairment on Oswestry and MCID on lumbar FOTO to demonstrate improved functional ability.  Baseline: 38% disability on Oswestry Goal status: IN PROGRESS   6.  Patient will tolerate 30 min of standing, sitting, or walking without increased pain to perform job duties.  Baseline: increased pain after 5 min Goal status: IN PROGRESS  7.  Patient will be able to lift 20lbs without increased back pain.   Baseline: lifting t-shirts increases pain  Goal status: IN PROGRESS  8. Patient will demonstrate full pain free cervical ROM to perform job activities.  Baseline: see objective.  Goal status: IN PROGRESS  PLAN: PT FREQUENCY: 1-2x/week  PT DURATION: 4 weeks - extended to 01/25/2022    PLANNED INTERVENTIONS: Therapeutic exercises, Therapeutic activity, Neuromuscular re-education, Balance training, Gait training, Patient/Family education, Self Care, Joint mobilization, Aquatic Therapy, Dry Needling, Electrical stimulation, Spinal mobilization, Cryotherapy, Moist heat, Traction, Manual therapy, and Re-evaluation.  PLAN FOR NEXT SESSION:  goal update, start neutral spine exercises, manual therapy, modalities PRN.  HEP updated but forgot to give.   Rennie Natter, PT, DPT 01/05/2022, 5:56 PM

## 2022-01-08 ENCOUNTER — Ambulatory Visit: Payer: PRIVATE HEALTH INSURANCE | Admitting: Physical Therapy

## 2022-01-08 ENCOUNTER — Encounter: Payer: Self-pay | Admitting: Physical Therapy

## 2022-01-08 DIAGNOSIS — M5441 Lumbago with sciatica, right side: Secondary | ICD-10-CM

## 2022-01-08 DIAGNOSIS — M546 Pain in thoracic spine: Secondary | ICD-10-CM

## 2022-01-08 DIAGNOSIS — M62830 Muscle spasm of back: Secondary | ICD-10-CM

## 2022-01-08 DIAGNOSIS — M5415 Radiculopathy, thoracolumbar region: Secondary | ICD-10-CM

## 2022-01-08 NOTE — Therapy (Signed)
OUTPATIENT PHYSICAL THERAPY Treatment Note   Patient Name: Natasha Horton MRN: [id number] DOB:[birth year], 46 y.o., female Today's Date: 01/08/2022   PT End of Session - 01/08/22 1105     Visit Number 7    Number of Visits 11    Date for PT Re-Evaluation 01/25/22    Authorization Type Workman's Comp    Authorization - Visit Number 5    Authorization - Number of Visits 11    PT Start Time 1102    PT Stop Time 1147    PT Time Calculation (min) 45 min    Activity Tolerance Patient tolerated treatment well    Behavior During Therapy WFL for tasks assessed/performed              Past Medical History:  Diagnosis Date   Sarcoidosis of lung ([place])    Uterine cancer ([place])    History reviewed. No pertinent surgical history. There are no problems to display for this patient.   PCP: not in system  REFERRING PROVIDER: Drucilla Chalet, NP  REFERRING DIAG: S29.011A (ICD-10-CM) - Strain of muscle and tendon of front wall of thorax, initial encounter S29.012A (ICD-10-CM) - Strain of muscle and tendon of back wall of thorax, initial encounter S20.121A (ICD-10-CM) - Blister (nonthermal) of breast, right breast, initial encounter S20.211A (ICD-10-CM) - Contusion of right front wall of thorax, initial encounter S39.011A (ICD-10-CM) - Strain of muscle, fascia and tendon of abdomen, initial encounter S39.012A (ICD-10-CM) - Strain of muscle, fascia and tendon of lower back, initial encounter R20.2 (ICD-10-CM) - Paresthesia of skin  Rationale for Evaluation and Treatment Rehabilitation  THERAPY DIAG:  Acute midline low back pain with right-sided sciatica  Pain in thoracic spine  Muscle spasm of back  Radiculopathy, thoracolumbar region  ONSET DATE: 11/19/2021  SUBJECTIVE:                                                                                                                                                                                           SUBJECTIVE  STATEMENT: Natasha Horton reports feeling like she has a lot of stress affecting her neck/shoulders.   Only feels low back with prolonged leaning forward, only had some muscle soreness after last session in low back, no prolonged pain.    PERTINENT HISTORY:  Lung Sarcoidosis; abnormal ECG, chest pain  (resolved), anxiety, past history uterine cancer, gall bladder removed  PAIN:  Are you having pain? Yes: NPRS scale: 4/10 Pain location: upper back, 0/10 low back      PRECAUTIONS: Back  work restrictions: no lifting more than 10 lbs (updated 8/7) , no lifting hands over head.  History of Cancer.   WEIGHT BEARING RESTRICTIONS No  FALLS:  Has patient fallen in last 6 months? No  LIVING ENVIRONMENT: Lives with: lives with their spouse and lives with their son Lives in: [place]/apartment Stairs: Yes: External: 8 steps; on right going up Has following equipment at home: None  OCCUPATION: Nurse at [employer]   PLOF: Independent  PATIENT GOALS improve pain   OBJECTIVE:   DIAGNOSTIC FINDINGS:  None available for review   PATIENT SURVEYS:  Modified Oswestry 38%= moderate disability  FOTO 55.51%  SCREENING FOR RED FLAGS: Bowel or bladder incontinence: No Spinal tumors: NA Cauda equina syndrome: No Compression fracture: No imaging Abdominal aneurysm: NA  COGNITION:  Overall cognitive status: Within functional limits for tasks assessed     SENSATION: Decreased R UE sensation along C5-6 dermatomes, also along R L3-5 dermatomes 50-75%   MUSCLE LENGTH: Hamstrings: Right 80 deg; Left 80 deg ely's test: increased pain R prone knee flexion into groin, R tighter than L.   POSTURE: No Significant postural limitations  PALPATION: Diffuse tenderness throughout thoracic and lumbar paraspinals, pain with PA mob from T2 to sacrum, reports increased tenderness T6-T10, bil QL, R glut med, R SIJ, R piriformis.   CERVICAL ROM: (12/16/2021)  AROM 12/16/2021  Cervical Flexion 50*   Cervical Extension 50*  Cervical Rotation to Left 60*   Cervical Rotation to Right 75* sounds like sandpaper  Left Sidebend 22*   Right Sidebend 25*   *pain.   CERVICAL SPECIAL TESTS:  Spurling's test: Negative, Distraction test: Positive, and Sharp pursor's test: Negative  LUMBAR ROM:   Active  A/PROM  eval  Flexion To knees, increases pain radiating to R going  Extension 50%, inc pain base of spine  Left lateral flexion Inc pain to R groin 50%  Right lateral flexion 50%, Inc pain upper back L side  Right rotation Pain shoots up back 50%  Left rotation 50% feels it in L shoulder, popping.    (Blank rows = not tested)  LOWER EXTREMITY ROM:   tightness and pain with all R hip movements.  L hip WNL   LOWER EXTREMITY MMT:    MMT Right eval Left eval  Hip flexion 5 5* pain  Hip extension    Hip abduction 5 5  Hip adduction 5 5  Knee flexion 5 5  Knee extension    Ankle dorsiflexion 5 5  Ankle plantarflexion     (Blank rows = not tested)  Upper Extremity MMT:  5/5 bil UE strength, but increased pain in thoracic spine with resistance.    LUMBAR SPECIAL TESTS:  Straight leg raise test: Negative and FABER test: Positive R hip with pain radiating to R groin  FUNCTIONAL TESTS:  5 times sit to stand: 22 seconds  GAIT: Distance walked: 50 Assistive device utilized: None Level of assistance: Complete Independence Comments: no significant deviation or device, increased pain with prolonged walking.   TODAY'S TREATMENT  01/08/22 Therapeutic Exercise: to improve strength and mobility.  Demo, verbal and tactile cues throughout for technique. UBE L1 x 6 min ([id number]) Rows RTB x 15  Shoulder extensions RTB x 15 Paloff press RTB x 10 bil - with arm extension  RDLs with bil 7# weights x 20 Dead rows to knees #7 each x 20  Single leg RDLs (no resistance) x 10 bil - difficulty with balance.  Neuromuscular Reeducation: to improve balance and stability. SBA for safety throughout.   On airex in corner: Eyes open feet apart  x 30 sec  Eyes open feet apart with head nods x 10 Eyes open feet apart with head turns x 10 Eyes closed feet apart x 30 sec Eyes closed feet apart with head nods x 10 Eyes closed feet apart with head turns x 10  Manual Therapy: to decrease muscle spasm and pain and improve mobility STM to cervical paraspinals, TPR bil UT/LS, UPA mobs upper thoracic spine.    01/05/22 Therapeutic Exercise: to improve strength and mobility.  Demo, verbal and tactile cues throughout for technique. Nustep L5 x 6 min  Bicep curls x 15 RTB Rows RTB x 15  Shoulder extensions RTB x 15 Paloff press RTB x 10 bil  RDLs with bil 5# weights x 10 Dead row to knees with bil 5# weights x 10 - felt more in l/s Overhead press blue weighted ball x 15 Seated hip flexion with blue weighted ball x 15 Quadruped cat/cows x 10  Child pose  Thread the needle quadruped x 10 bil  Bird dogs 2 x 10 bil  Manual Therapy: to decrease muscle spasm and pain and improve mobility PA/ R UPA mobs thoracic spine, IASTM with s/s tool to R rhomboids, Scapular mobs R, STM to levator scapulae and cervical paraspinals.   01/01/2022 Therapeutic Exercise: to improve strength and mobility.  Nustep L5 x 6 min  Open books at wall x 10 bil  Wall push-ups x 15 Wall angels x 15 In supine: Thoracic mobs on foam roller -crosswise Lengthwise on foam roller:  Shoulder flexion  x 10 bil  Y-arms x 15 Manual Therapy: to decrease muscle spasm and pain and improve mobility PA mobs thoracic spine, STM/TPR to L levator, upper trap, cervical paraspinals, UPA mobs thoracic and cervical spine; skilled palpation and monitoring during dry needling. Trigger Point Dry-Needling  Treatment instructions: Expect mild to moderate muscle soreness. S/S of pneumothorax if dry needled over a lung field, and to seek immediate medical attention should they occur. Patient verbalized understanding of these instructions and  education.  Patient Consent Given: Yes Education handout provided: Previously provided Muscles treated: L levator scapulae and UT Treatment response/outcome: Twitch Response Elicited and Palpable Increase in Muscle Length  12/23/21 Therapeutic Exercise: Nustep L4x70mn Seated thoracic 10x Seated cervical AAROM w/ pillowcase x 10 Standing row yellow TB x 10 Standing shld extension yellow TB x 10  Manual Therapy: STM to B cervical and thoracic PS, UT, suboccipitals    PATIENT EDUCATION:  Education details: added habituation exercises Person educated: Patient Education method: EConsulting civil engineer Demonstration, Verbal cues, and Handouts Education comprehension: verbalized understanding and returned demonstration   HOME EXERCISE PROGRAM: [facility]  ASSESSMENT:  CLINICAL IMPRESSION: MSalinais tolerating progression of loading lumbar spine without pain.  She did demonstrate some instability with single leg RDLs, and reported dizziness with head extension, which could indicate cervicogenic dizziness, so reviewed then added habituation exercises with head movements, to perform at home in corner on level ground for safety, followed by manual therapy to cervical paraspinals and thoracic spine.  Reported decreased pain following interventions.    Natasha Horton continues to demonstrate potential for improvement and would benefit from continued skilled therapy to address impairments.      OBJECTIVE IMPAIRMENTS decreased activity tolerance, decreased endurance, decreased mobility, difficulty walking, decreased ROM, hypomobility, increased fascial restrictions, impaired perceived functional ability, increased muscle spasms, impaired flexibility, impaired UE functional use, and pain.   ACTIVITY LIMITATIONS carrying, lifting, bending, sitting, standing, squatting, transfers, reach over  head, locomotion level, and caring for others  PARTICIPATION LIMITATIONS: meal  prep, cleaning, laundry, shopping, community activity, occupation, and yard work  PERSONAL FACTORS 1-2 comorbidities: history sarcoidosis, cancer, anxiety  are also affecting patient's functional outcome.   REHAB POTENTIAL: Good  CLINICAL DECISION MAKING: Evolving/moderate complexity  EVALUATION COMPLEXITY: Moderate   GOALS: Goals reviewed with patient? Yes  SHORT TERM GOALS: Target date: 12/23/2021   Patient will be independent with initial HEP.  Baseline: no HEP Goal status: MET - 12/23/21  2.  Patient will report centralization of radicular symptoms.  Baseline: pain radiates to R groin Goal status: MET 12/16/21- no pain in groin.  01/01/2022- no pain or radicular symptoms for low back.   3.  Patient will be able to raise arms overhead without increased pain to perform job activities.  Baseline: increases thoracic pain Goal status: IN PROGRESS   LONG TERM GOALS: Target date: 01/08/2022 extended to 01/25/2022   Patient will be independent with advanced/ongoing HEP to improve outcomes and carryover.  Baseline: no HEP Goal status: IN PROGRESS  2.  Patient will report 75% improvement in low back pain to improve QOL.  Baseline: 8-9/10 thoracic and lumbar pain Goal status: IN PROGRESS 01/08/2022 - no lower back pain, still has thoracic back pain.   3.  Patient will demonstrate full pain free lumbar ROM to perform ADLs.   Baseline: see objective, increased pain with all movements Goal status: IN PROGRESS  4.  Patient will demonstrate improved functional strength as demonstrated by 5x STS < 14 seconds. Baseline: 22 seconds Goal status: IN PROGRESS  5.  Patient will report <20% impairment on Oswestry and MCID on lumbar FOTO to demonstrate improved functional ability.  Baseline: 38% disability on Oswestry Goal status: IN PROGRESS   6.  Patient will tolerate 30 min of standing, sitting, or walking without increased pain to perform job duties.  Baseline: increased pain after 5  min Goal status: IN PROGRESS  7.  Patient will be able to lift 20lbs without increased back pain.   Baseline: lifting t-shirts increases pain  Goal status: IN PROGRESS  8. Patient will demonstrate full pain free cervical ROM to perform job activities.  Baseline: see objective.  Goal status: IN PROGRESS  PLAN: PT FREQUENCY: 1-2x/week  PT DURATION: 4 weeks - extended to 01/25/2022    PLANNED INTERVENTIONS: Therapeutic exercises, Therapeutic activity, Neuromuscular re-education, Balance training, Gait training, Patient/Family education, Self Care, Joint mobilization, Aquatic Therapy, Dry Needling, Electrical stimulation, Spinal mobilization, Cryotherapy, Moist heat, Traction, Manual therapy, and Re-evaluation.  PLAN FOR NEXT SESSION:  goal update, start neutral spine exercises, manual therapy, modalities PRN.   Rennie Natter, PT, DPT 01/08/2022, 11:58 AM

## 2022-01-11 ENCOUNTER — Ambulatory Visit: Payer: PRIVATE HEALTH INSURANCE

## 2022-01-11 DIAGNOSIS — M5441 Lumbago with sciatica, right side: Secondary | ICD-10-CM | POA: Diagnosis not present

## 2022-01-11 DIAGNOSIS — M62830 Muscle spasm of back: Secondary | ICD-10-CM

## 2022-01-11 DIAGNOSIS — M5415 Radiculopathy, thoracolumbar region: Secondary | ICD-10-CM

## 2022-01-11 DIAGNOSIS — M546 Pain in thoracic spine: Secondary | ICD-10-CM

## 2022-01-11 NOTE — Therapy (Signed)
OUTPATIENT PHYSICAL THERAPY Treatment Note   Patient Name: Natasha Horton MRN: [id number] DOB:[birth year], 46 y.o., female Today's Date: 01/11/2022   PT End of Session - 01/11/22 1151     Visit Number 8    Number of Visits 11    Date for PT Re-Evaluation 01/25/22    Authorization Type Workman's Comp    Authorization - Visit Number 7    Authorization - Number of Visits 11    PT Start Time 1104    PT Stop Time 1148    PT Time Calculation (min) 44 min    Activity Tolerance Patient tolerated treatment well    Behavior During Therapy WFL for tasks assessed/performed               Past Medical History:  Diagnosis Date   Sarcoidosis of lung ([place])    Uterine cancer ([place])    History reviewed. No pertinent surgical history. There are no problems to display for this patient.   PCP: not in system  REFERRING PROVIDER: Drucilla Chalet, NP  REFERRING DIAG: S29.011A (ICD-10-CM) - Strain of muscle and tendon of front wall of thorax, initial encounter S29.012A (ICD-10-CM) - Strain of muscle and tendon of back wall of thorax, initial encounter S20.121A (ICD-10-CM) - Blister (nonthermal) of breast, right breast, initial encounter S20.211A (ICD-10-CM) - Contusion of right front wall of thorax, initial encounter S39.011A (ICD-10-CM) - Strain of muscle, fascia and tendon of abdomen, initial encounter S39.012A (ICD-10-CM) - Strain of muscle, fascia and tendon of lower back, initial encounter R20.2 (ICD-10-CM) - Paresthesia of skin  Rationale for Evaluation and Treatment Rehabilitation  THERAPY DIAG:  Acute midline low back pain with right-sided sciatica  Pain in thoracic spine  Muscle spasm of back  Radiculopathy, thoracolumbar region  ONSET DATE: 11/19/2021  SUBJECTIVE:                                                                                                                                                                                           SUBJECTIVE  STATEMENT: Feeling good,    PERTINENT HISTORY:  Lung Sarcoidosis; abnormal ECG, chest pain  (resolved), anxiety, past history uterine cancer, gall bladder removed  PAIN:  Are you having pain? Yes: NPRS scale: 4/10 Pain location: upper back, 0/10 low back      PRECAUTIONS: Back  work restrictions: no lifting more than 10 lbs (updated 8/7) , no lifting hands over head.  History of Cancer.   WEIGHT BEARING RESTRICTIONS No  FALLS:  Has patient fallen in last 6 months? 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  PATIENT GOALS improve pain   OBJECTIVE:   DIAGNOSTIC FINDINGS:  None available for review   PATIENT SURVEYS:  Modified Oswestry 38%= moderate disability  FOTO 55.51%  SCREENING FOR RED FLAGS: Bowel or bladder incontinence: No Spinal tumors: NA Cauda equina syndrome: No Compression fracture: No imaging Abdominal aneurysm: NA  COGNITION:  Overall cognitive status: Within functional limits for tasks assessed     SENSATION: Decreased R UE sensation along C5-6 dermatomes, also along R L3-5 dermatomes 50-75%   MUSCLE LENGTH: Hamstrings: Right 80 deg; Left 80 deg ely's test: increased pain R prone knee flexion into groin, R tighter than L.   POSTURE: No Significant postural limitations  PALPATION: Diffuse tenderness throughout thoracic and lumbar paraspinals, pain with PA mob from T2 to sacrum, reports increased tenderness T6-T10, bil QL, R glut med, R SIJ, R piriformis.   CERVICAL ROM: (12/16/2021)  AROM 12/16/2021  Cervical Flexion 50*  Cervical Extension 50*  Cervical Rotation to Left 60*   Cervical Rotation to Right 75* sounds like sandpaper  Left Sidebend 22*   Right Sidebend 25*   *pain.   CERVICAL SPECIAL TESTS:  Spurling's test: Negative,  Distraction test: Positive, and Sharp pursor's test: Negative  LUMBAR ROM:   Active  A/PROM  eval 01/11/22  Flexion To knees, increases pain radiating to R going To ankles  Extension 50%, inc pain base of spine WFL  Left lateral flexion Inc pain to R groin 50% To lateral knee  Right lateral flexion 50%, Inc pain upper back L side To lateral knee  Right rotation Pain shoots up back 50% WFL  Left rotation 50% feels it in L shoulder, popping.  WFL   (Blank rows = not tested)  LOWER EXTREMITY ROM:   tightness and pain with all R hip movements.  L hip WNL   LOWER EXTREMITY MMT:    MMT Right eval Left eval  Hip flexion 5 5* pain  Hip extension    Hip abduction 5 5  Hip adduction 5 5  Knee flexion 5 5  Knee extension    Ankle dorsiflexion 5 5  Ankle plantarflexion     (Blank rows = not tested)  Upper Extremity MMT:  5/5 bil UE strength, but increased pain in thoracic spine with resistance.    LUMBAR SPECIAL TESTS:  Straight leg raise test: Negative and FABER test: Positive R hip with pain radiating to R groin  FUNCTIONAL TESTS:  5 times sit to stand: 22 seconds  GAIT: Distance walked: 50 Assistive device utilized: None Level of assistance: Complete Independence Comments: no significant deviation or device, increased pain with prolonged walking.   TODAY'S TREATMENT  01/11/22 Therapeutic Exercise: UBE L1 32f3b Prone press up 3x10" Cat/cow 10x Childs pose 2x30" Thread the needle 5x each way 3 sec hold Single leg RDL with 3lb weight x 10 RDL with orange weight ball x 10 Pigeon pose 15 sec hold bil  01/08/22 Therapeutic Exercise: to improve strength and mobility.  Demo, verbal and tactile cues throughout for technique. UBE L1 x 6 min (385fb) Rows RTB x 15  Shoulder extensions RTB x 15 Paloff press RTB x 10 bil - with arm extension  RDLs with bil 7# weights x 20 Dead rows to knees #7 each x 20  Single leg RDLs (no resistance) x 10 bil - difficulty with balance.  Neuromuscular Reeducation: to improve balance and stability. SBA for safety throughout.  On airex in corner: Eyes open feet apart x 30 sec  Eyes open feet apart with head nods x 10 Eyes open feet apart with head turns x 10 Eyes closed feet apart x 30 sec Eyes closed feet apart with head nods x 10 Eyes closed feet apart with head turns x 10  Manual Therapy: to decrease muscle spasm and pain and improve mobility STM to cervical paraspinals, TPR bil UT/LS, UPA mobs upper thoracic spine.    01/05/22 Therapeutic Exercise: to improve strength and mobility.  Demo, verbal and tactile cues throughout for technique. Nustep L5 x 6 min  Bicep curls x 15 RTB Rows RTB x 15  Shoulder extensions RTB x 15 Paloff press RTB x 10 bil  RDLs with bil 5# weights x 10 Dead row to knees with bil 5# weights x 10 - felt more in l/s Overhead press blue weighted ball x 15 Seated hip flexion with blue weighted ball x 15 Quadruped cat/cows x 10  Child pose  Thread the needle quadruped x 10 bil  Bird dogs 2 x 10 bil  Manual Therapy: to decrease muscle spasm and pain and improve mobility PA/ R UPA mobs thoracic spine, IASTM with s/s tool to R rhomboids, Scapular mobs R, STM to levator scapulae and cervical paraspinals.     PATIENT EDUCATION:  Education details: HEP update- press up and pigeon pose Person educated: Patient Education method: Explanation, Demonstration, Verbal cues, and Handouts Education comprehension: verbalized understanding and returned demonstration   HOME EXERCISE PROGRAM: Access Code: [facility] 8/22/203 update  ASSESSMENT:  CLINICAL IMPRESSION: Pt no longer has pain with any lumbar movements and shows to have motion WFL so she has met LTG #3. She is able to work 7 hour shift w/o increased pain just yet however has yet to return to full 12 hour shift. Provided updates for HEP, progressing yoga exercises to allow for greater mobility along the spine. Also incorporated single leg WB  exercises for progression of hip strengthening. No issues with interventions except noting some stiffness with press ups and thread needle.  OBJECTIVE IMPAIRMENTS decreased activity tolerance, decreased endurance, decreased mobility, difficulty walking, decreased ROM, hypomobility, increased fascial restrictions, impaired perceived functional ability, increased muscle spasms, impaired flexibility, impaired UE functional use, and pain.   ACTIVITY LIMITATIONS carrying, lifting, bending, sitting, standing, squatting, transfers, reach over head, locomotion level, and caring for others  PARTICIPATION LIMITATIONS: meal prep, cleaning, laundry, shopping, community activity, occupation, and yard work  PERSONAL FACTORS 1-2 comorbidities: history sarcoidosis, cancer, anxiety  are also affecting patient's functional outcome.   REHAB POTENTIAL: Good  CLINICAL DECISION MAKING: Evolving/moderate complexity  EVALUATION COMPLEXITY: Moderate   GOALS: Goals reviewed with patient? Yes  SHORT TERM GOALS: Target date: 12/23/2021   Patient will be independent with initial HEP.  Baseline: no HEP Goal status: MET - 12/23/21  2.  Patient will report centralization of radicular symptoms.  Baseline: pain radiates to R groin Goal status: MET 12/16/21- no pain in groin.  01/01/2022- no pain or radicular symptoms for low back.   3.  Patient will be able to raise arms overhead without increased pain to perform job activities.  Baseline: increases thoracic pain Goal status: MET 01/11/22   LONG TERM GOALS: Target date: 01/08/2022 extended to 01/25/2022   Patient will be independent with advanced/ongoing HEP to improve outcomes and carryover.  Baseline: no HEP Goal status: IN PROGRESS  2.  Patient will  report 75% improvement in low back pain to improve QOL.  Baseline: 8-9/10 thoracic and lumbar pain Goal status: IN PROGRESS 01/08/2022 - no lower back pain, still has thoracic back pain.   3.  Patient will demonstrate  full pain free lumbar ROM to perform ADLs.   Baseline: see objective, increased pain with all movements Goal status: MET 01/11/22  4.  Patient will demonstrate improved functional strength as demonstrated by 5x STS < 14 seconds. Baseline: 22 seconds Goal status: IN PROGRESS  5.  Patient will report <20% impairment on Oswestry and MCID on lumbar FOTO to demonstrate improved functional ability.  Baseline: 38% disability on Oswestry Goal status: IN PROGRESS   6.  Patient will tolerate 30 min of standing, sitting, or walking without increased pain to perform job duties.  Baseline: increased pain after 5 min Goal status: IN PROGRESS - able to work  8am - 3 pm w/o increased pain (01/11/22)  7.  Patient will be able to lift 20lbs without increased back pain.   Baseline: lifting t-shirts increases pain  Goal status: IN PROGRESS  8. Patient will demonstrate full pain free cervical ROM to perform job activities.  Baseline: see objective.  Goal status: IN PROGRESS  PLAN: PT FREQUENCY: 1-2x/week  PT DURATION: 4 weeks - extended to 01/25/2022    PLANNED INTERVENTIONS: Therapeutic exercises, Therapeutic activity, Neuromuscular re-education, Balance training, Gait training, Patient/Family education, Self Care, Joint mobilization, Aquatic Therapy, Dry Needling, Electrical stimulation, Spinal mobilization, Cryotherapy, Moist heat, Traction, Manual therapy, and Re-evaluation.  PLAN FOR NEXT SESSION:  start neutral spine exercises, manual therapy, modalities PRN.   Artist Pais, PTA 01/11/2022, 11:55 AM

## 2022-01-21 ENCOUNTER — Ambulatory Visit: Payer: PRIVATE HEALTH INSURANCE | Attending: Nurse Practitioner | Admitting: Physical Therapy

## 2022-01-21 DIAGNOSIS — M5441 Lumbago with sciatica, right side: Secondary | ICD-10-CM | POA: Insufficient documentation

## 2022-01-21 DIAGNOSIS — M5415 Radiculopathy, thoracolumbar region: Secondary | ICD-10-CM | POA: Insufficient documentation

## 2022-01-21 DIAGNOSIS — M62830 Muscle spasm of back: Secondary | ICD-10-CM | POA: Insufficient documentation

## 2022-01-21 DIAGNOSIS — M546 Pain in thoracic spine: Secondary | ICD-10-CM | POA: Insufficient documentation

## 2022-01-25 ENCOUNTER — Ambulatory Visit: Payer: PRIVATE HEALTH INSURANCE

## 2022-01-28 ENCOUNTER — Encounter: Payer: 59 | Admitting: Physical Therapy

## 2022-02-05 ENCOUNTER — Encounter: Payer: Self-pay | Admitting: Physical Therapy

## 2022-02-05 ENCOUNTER — Ambulatory Visit: Payer: PRIVATE HEALTH INSURANCE | Admitting: Physical Therapy

## 2022-02-05 DIAGNOSIS — M62830 Muscle spasm of back: Secondary | ICD-10-CM | POA: Diagnosis present

## 2022-02-05 DIAGNOSIS — M5441 Lumbago with sciatica, right side: Secondary | ICD-10-CM | POA: Diagnosis present

## 2022-02-05 DIAGNOSIS — M5415 Radiculopathy, thoracolumbar region: Secondary | ICD-10-CM | POA: Diagnosis present

## 2022-02-05 DIAGNOSIS — M546 Pain in thoracic spine: Secondary | ICD-10-CM

## 2022-02-05 NOTE — Therapy (Addendum)
OUTPATIENT PHYSICAL THERAPY TREATMENT PHYSICAL THERAPY DISCHARGE SUMMARY  Visits from Start of Care: 9  Current functional level related to goals / functional outcomes: Significant improvement in pain and mobility.  Met most goals and has returned to work.  FOTO was 94%   Remaining deficits: Lifting heavier items (>10 lbs) overhead   Education / Equipment: HEP  Plan: Patient agrees to discharge.  Patient is being discharged due to meeting the stated rehab goals.   Plan was to discharge next visit but had to cancel due to work.   Rennie Natter, PT, DPT 9:40 AM 03/26/2022    Patient Name: Natasha Horton MRN: [id number] DOB:13-Aug-1975, 46 y.o., female Today's Date: 02/05/2022   PT End of Session - 02/05/22 1110     Visit Number 9    Number of Visits 11    Date for PT Re-Evaluation 01/25/22    Authorization Type Workman's Comp    Authorization - Visit Number 7    Authorization - Number of Visits 11    PT Start Time 1108    PT Stop Time 1148    PT Time Calculation (min) 40 min    Activity Tolerance Patient tolerated treatment well    Behavior During Therapy WFL for tasks assessed/performed               Past Medical History:  Diagnosis Date   Sarcoidosis of lung ([place])    Uterine cancer ([place])    History reviewed. No pertinent surgical history. There are no problems to display for this patient.   PCP: not in system  REFERRING PROVIDER: Drucilla Chalet, NP  REFERRING DIAG: S29.011A (ICD-10-CM) - Strain of muscle and tendon of front wall of thorax, initial encounter S29.012A (ICD-10-CM) - Strain of muscle and tendon of back wall of thorax, initial encounter S20.121A (ICD-10-CM) - Blister (nonthermal) of breast, right breast, initial encounter S20.211A (ICD-10-CM) - Contusion of right front wall of thorax, initial encounter S39.011A (ICD-10-CM) - Strain of muscle, fascia and tendon of abdomen, initial encounter S39.012A (ICD-10-CM) - Strain of muscle, fascia  and tendon of lower back, initial encounter R20.2 (ICD-10-CM) - Paresthesia of skin  Rationale for Evaluation and Treatment Rehabilitation  THERAPY DIAG:  Acute midline low back pain with right-sided sciatica  Pain in thoracic spine  Muscle spasm of back  Radiculopathy, thoracolumbar region  ONSET DATE: 11/19/2021  SUBJECTIVE:                                                                                                                                                                                           SUBJECTIVE STATEMENT: [place] reports  she has been back to work without restrictions, not having any pain.  Work has been short staffed and making her work overtime, so has not been able to come to PT for a couple of weeks.  Still not formally released from workman's comp.  Would like to join gym and start using weight machines again.   PERTINENT HISTORY:  Lung Sarcoidosis; abnormal ECG, chest pain  (resolved), anxiety, past history uterine cancer, gall bladder removed  PAIN:  Are you having pain? Yes: NPRS scale: 0/10 Pain location: upper back, 0/10 low back      PRECAUTIONS: Back  work restrictions: no lifting more than 10 lbs (updated 8/7) , no lifting hands over head.  History of Cancer.   WEIGHT BEARING RESTRICTIONS No  FALLS:  Has patient fallen in last 6 months? No  LIVING ENVIRONMENT: Lives with: lives with their spouse and lives with their son Lives in: [place]/apartment Stairs: Yes: External: 8 steps; on right going up Has following equipment at home: None  OCCUPATION: Nurse at [employer]   PLOF: Independent  PATIENT GOALS improve pain   OBJECTIVE:   DIAGNOSTIC FINDINGS:  None available for review   PATIENT SURVEYS:  Modified Oswestry 38%= moderate disability  FOTO 55.51%  SCREENING FOR RED FLAGS: Bowel or bladder incontinence: No Spinal tumors: NA Cauda equina syndrome: No Compression fracture: No imaging Abdominal aneurysm:  NA  COGNITION:  Overall cognitive status: Within functional limits for tasks assessed     SENSATION: Decreased R UE sensation along C5-6 dermatomes, also along R L3-5 dermatomes 50-75%   MUSCLE LENGTH: Hamstrings: Right 80 deg; Left 80 deg ely's test: increased pain R prone knee flexion into groin, R tighter than L.   POSTURE: No Significant postural limitations  PALPATION: Diffuse tenderness throughout thoracic and lumbar paraspinals, pain with PA mob from T2 to sacrum, reports increased tenderness T6-T10, bil QL, R glut med, R SIJ, R piriformis.   CERVICAL ROM: (12/16/2021)  AROM 12/16/2021 02/05/2022  Cervical Flexion 50* 70  Cervical Extension 50* 50  Cervical Rotation to Left 60*  74  Cervical Rotation to Right 75* sounds like sandpaper 75  Left Sidebend 22*  30  Right Sidebend 25* 35   *pain.   CERVICAL SPECIAL TESTS:  Spurling's test: Negative, Distraction test: Positive, and Sharp pursor's test: Negative  LUMBAR ROM:   Active  A/PROM  eval 01/11/22  Flexion To knees, increases pain radiating to R going To ankles  Extension 50%, inc pain base of spine WFL  Left lateral flexion Inc pain to R groin 50% To lateral knee  Right lateral flexion 50%, Inc pain upper back L side To lateral knee  Right rotation Pain shoots up back 50% WFL  Left rotation 50% feels it in L shoulder, popping.  WFL   (Blank rows = not tested)  LOWER EXTREMITY ROM:   tightness and pain with all R hip movements.  L hip WNL   LOWER EXTREMITY MMT:    MMT Right eval Left eval  Hip flexion 5 5* pain  Hip extension    Hip abduction 5 5  Hip adduction 5 5  Knee flexion 5 5  Knee extension    Ankle dorsiflexion 5 5  Ankle plantarflexion     (Blank rows = not tested)  Upper Extremity MMT:  5/5 bil UE strength, but increased pain in thoracic spine with resistance.    LUMBAR SPECIAL TESTS:  Straight leg raise test: Negative and FABER test: Positive R hip with  pain radiating to R  groin  FUNCTIONAL TESTS:  5 times sit to stand: 22 seconds  GAIT: Distance walked: 50 Assistive device utilized: None Level of assistance: Complete Independence Comments: no significant deviation or device, increased pain with prolonged walking.   TODAY'S TREATMENT  01/25/2022 Therapeutic Exercise: to improve strength and mobility.  Demo, verbal and tactile cues throughout for technique. Bike L3 x 6 min  Lat pulls 25# 1 x 10, 35# 2 x 10  Leg press 25# 1 x 10, 45# 1 x 10, 55# 1 x 10  Rows 25# 1 x 10, 35# 2 x 10  Squat to overhead press with 10# weight x 10  Sun salutations x 2 rounds  01/11/22 Therapeutic Exercise: UBE L1 79f/3b Prone press up 3x10" Cat/cow 10x Childs pose 2x30" Thread the needle 5x each way 3 sec hold Single leg RDL with 3lb weight x 10 RDL with orange weight ball x 10 Pigeon pose 15 sec hold bil  01/08/22 Therapeutic Exercise: to improve strength and mobility.  Demo, verbal and tactile cues throughout for technique. UBE L1 x 6 min (3f/3b) Rows RTB x 15  Shoulder extensions RTB x 15 Paloff press RTB x 10 bil - with arm extension  RDLs with bil 7# weights x 20 Dead rows to knees #7 each x 20  Single leg RDLs (no resistance) x 10 bil - difficulty with balance.  Neuromuscular Reeducation: to improve balance and stability. SBA for safety throughout.  On airex in corner: Eyes open feet apart x 30 sec  Eyes open feet apart with head nods x 10 Eyes open feet apart with head turns x 10 Eyes closed feet apart x 30 sec Eyes closed feet apart with head nods x 10 Eyes closed feet apart with head turns x 10  Manual Therapy: to decrease muscle spasm and pain and improve mobility STM to cervical paraspinals, TPR bil UT/LS, UPA mobs upper thoracic spine.    01/05/22 Therapeutic Exercise: to improve strength and mobility.  Demo, verbal and tactile cues throughout for technique. Nustep L5 x 6 min  Bicep curls x 15 RTB Rows RTB x 15  Shoulder extensions RTB x 15 Paloff  press RTB x 10 bil  RDLs with bil 5# weights x 10 Dead row to knees with bil 5# weights x 10 - felt more in l/s Overhead press blue weighted ball x 15 Seated hip flexion with blue weighted ball x 15 Quadruped cat/cows x 10  Child pose  Thread the needle quadruped x 10 bil  Bird dogs 2 x 10 bil  Manual Therapy: to decrease muscle spasm and pain and improve mobility PA/ R UPA mobs thoracic spine, IASTM with s/s tool to R rhomboids, Scapular mobs R, STM to levator scapulae and cervical paraspinals.     PATIENT EDUCATION:  Education details: HEP update- press up and pigeon pose Person educated: Patient Education method: Explanation, Demonstration, Verbal cues, and Handouts Education comprehension: verbalized understanding and returned demonstration   HOME EXERCISE PROGRAM: Access Code: [facility] 8/22/203 update  ASSESSMENT:  CLINICAL IMPRESSION: Patient reports she has been working full shifts again and has not had any pain.  Today we progressed exercises to include more gym and yoga based movements, as she has not yet returned to full workouts.  She tolerated low resistance without difficulty today.  Her FOTO has improved to 94%, and she was able to lift 10# overhead today without pain.  She has met LTG #2, 4 and 6 today and close  to meeting remaining goals.   OBJECTIVE IMPAIRMENTS decreased activity tolerance, decreased endurance, decreased mobility, difficulty walking, decreased ROM, hypomobility, increased fascial restrictions, impaired perceived functional ability, increased muscle spasms, impaired flexibility, impaired UE functional use, and pain.   ACTIVITY LIMITATIONS carrying, lifting, bending, sitting, standing, squatting, transfers, reach over head, locomotion level, and caring for others  PARTICIPATION LIMITATIONS: meal prep, cleaning, laundry, shopping, community activity, occupation, and yard work  PERSONAL FACTORS 1-2 comorbidities: history sarcoidosis, cancer, anxiety   are also affecting patient's functional outcome.   REHAB POTENTIAL: Good  CLINICAL DECISION MAKING: Evolving/moderate complexity  EVALUATION COMPLEXITY: Moderate   GOALS: Goals reviewed with patient? Yes  SHORT TERM GOALS: Target date: 12/23/2021   Patient will be independent with initial HEP.  Baseline: no HEP Goal status: MET - 12/23/21  2.  Patient will report centralization of radicular symptoms.  Baseline: pain radiates to R groin Goal status: MET 12/16/21- no pain in groin.  01/01/2022- no pain or radicular symptoms for low back.   3.  Patient will be able to raise arms overhead without increased pain to perform job activities.  Baseline: increases thoracic pain Goal status: MET 01/11/22   LONG TERM GOALS: Target date: 01/08/2022 extended to 01/25/2022   Patient will be independent with advanced/ongoing HEP to improve outcomes and carryover.  Baseline: no HEP Goal status: IN PROGRESS 02/05/22 advanced  2.  Patient will report 75% improvement in low back pain to improve QOL.  Baseline: 8-9/10 thoracic and lumbar pain Goal status: MET 01/08/2022 - no lower back pain, still has thoracic back pain. 02/05/22- no pain  3.  Patient will demonstrate full pain free lumbar ROM to perform ADLs.   Baseline: see objective, increased pain with all movements Goal status: MET 01/11/22  4.  Patient will demonstrate improved functional strength as demonstrated by 5x STS < 14 seconds. Baseline: 22 seconds Goal status: MET 02/05/22 9.2 seconds.   5.  Patient will report <20% impairment on Oswestry and MCID on FOTO to demonstrate improved functional ability.  Baseline: 38% disability on Oswestry, 55% ability on FOTO Goal status: IN PROGRESS 02/05/2022 94% ability on FOTO  6.  Patient will tolerate 30 min of standing, sitting, or walking without increased pain to perform job duties.  Baseline: increased pain after 5 min Goal status: MET - able to work  8am - 3 pm w/o increased pain (01/11/22)  02/05/2022 no pain with activity  7.  Patient will be able to lift 20lbs without increased back pain.   Baseline: lifting t-shirts increases pain  Goal status: IN PROGRESS 02/05/22- can lift 10# overhead without pain  8. Patient will demonstrate full pain free cervical ROM to perform job activities.  Baseline: see objective.  Goal status: MET 02/05/22 see objective  PLAN: PT FREQUENCY: 1-2x/week  PT DURATION: 4 weeks - extended to 02/10/2022    PLANNED INTERVENTIONS: Therapeutic exercises, Therapeutic activity, Neuromuscular re-education, Balance training, Gait training, Patient/Family education, Self Care, Joint mobilization, Aquatic Therapy, Dry Needling, Electrical stimulation, Spinal mobilization, Cryotherapy, Moist heat, Traction, Manual therapy, and Re-evaluation.  PLAN FOR NEXT SESSION:  progress gym based exercise with increased resistance.   Rennie Natter, PT, DPT  02/05/2022, 12:02 PM

## 2022-02-10 ENCOUNTER — Ambulatory Visit: Payer: PRIVATE HEALTH INSURANCE | Admitting: Physical Therapy

## 2022-04-15 DIAGNOSIS — L2089 Other atopic dermatitis: Secondary | ICD-10-CM | POA: Diagnosis not present

## 2022-04-15 DIAGNOSIS — Z1231 Encounter for screening mammogram for malignant neoplasm of breast: Secondary | ICD-10-CM | POA: Diagnosis not present

## 2022-04-15 DIAGNOSIS — Z23 Encounter for immunization: Secondary | ICD-10-CM | POA: Diagnosis not present

## 2022-04-15 DIAGNOSIS — Z1211 Encounter for screening for malignant neoplasm of colon: Secondary | ICD-10-CM | POA: Diagnosis not present

## 2022-04-15 DIAGNOSIS — Z0000 Encounter for general adult medical examination without abnormal findings: Secondary | ICD-10-CM | POA: Diagnosis not present

## 2022-04-29 ENCOUNTER — Other Ambulatory Visit (HOSPITAL_COMMUNITY): Payer: Self-pay

## 2022-04-29 MED ORDER — SERTRALINE HCL 100 MG PO TABS
100.0000 mg | ORAL_TABLET | Freq: Every day | ORAL | 0 refills | Status: AC
  Filled 2022-04-29 – 2022-06-19 (×3): qty 90, 90d supply, fill #0

## 2022-05-11 ENCOUNTER — Other Ambulatory Visit (HOSPITAL_COMMUNITY): Payer: Self-pay

## 2022-06-08 DIAGNOSIS — Z23 Encounter for immunization: Secondary | ICD-10-CM | POA: Diagnosis not present

## 2022-06-19 ENCOUNTER — Other Ambulatory Visit (HOSPITAL_COMMUNITY): Payer: Self-pay

## 2023-05-09 IMAGING — CR DG CHEST 2V
2 series · 2 of 2 positions shown · non-contrast
Comparison: None.

CLINICAL DATA: Chest pain

EXAM:
CHEST - 2 VIEW

[w chest pa]
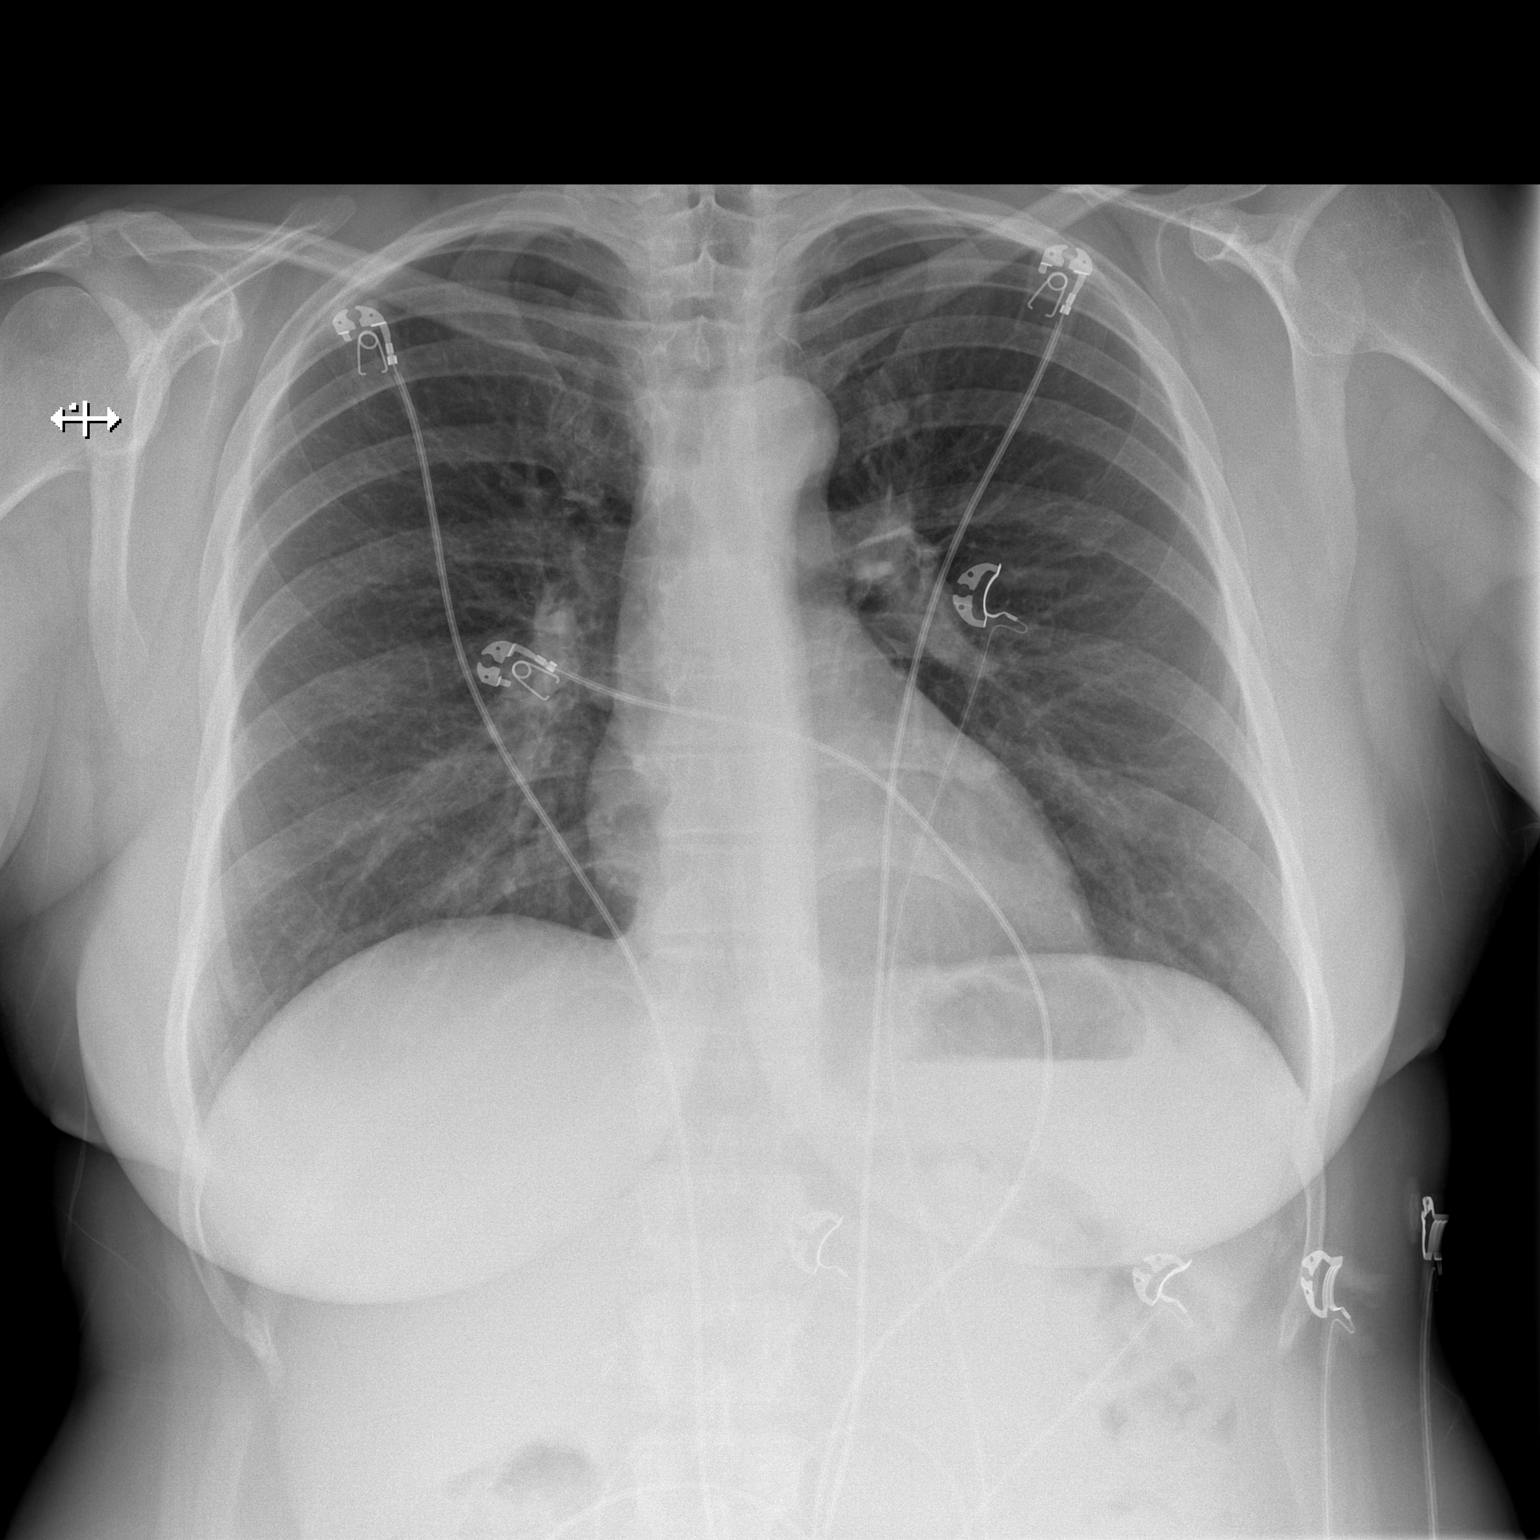

[w chest lat]
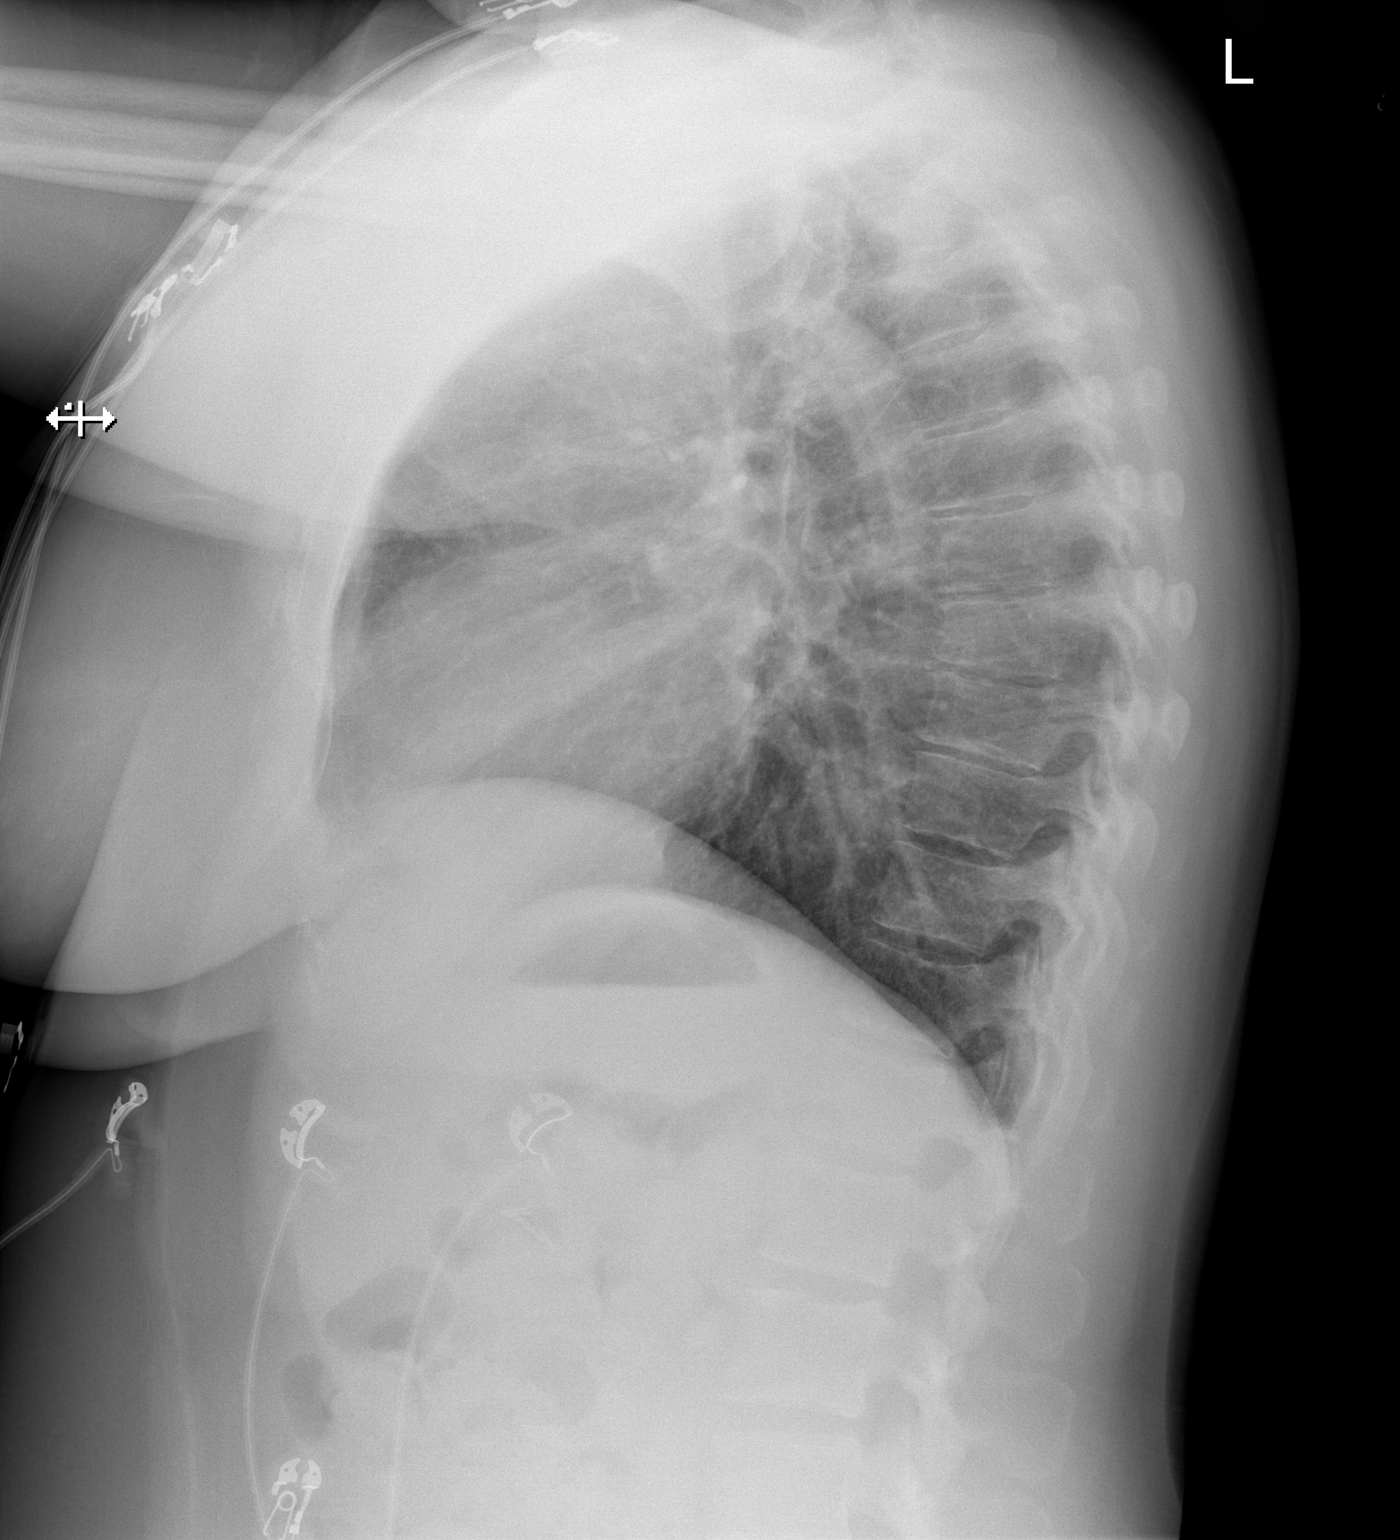

[2 of 2 positions shown; findings below may reference images not displayed]

FINDINGS: Heart size and mediastinal contours are within normal limits. No
suspicious opacities identified. No pleural effusion or pneumothorax
visualized. No acute osseous abnormality appreciated.
IMPRESSION: No acute intrathoracic process identified.

## 2024-01-13 ENCOUNTER — Other Ambulatory Visit: Payer: Self-pay

## 2024-04-11 ENCOUNTER — Other Ambulatory Visit: Payer: Self-pay
